# Patient Record
Sex: Female | Born: 1965 | Race: Black or African American | Hispanic: No | Marital: Single | Smoking: Former smoker
Health system: Southern US, Community
[De-identification: ages and names within clinical notes are randomized; demographics above are authoritative.]

## PROBLEM LIST (undated history)

## (undated) DIAGNOSIS — I10 Essential (primary) hypertension: Secondary | ICD-10-CM

## (undated) HISTORY — DX: Essential (primary) hypertension: I10

---

## 2000-01-23 ENCOUNTER — Emergency Department (HOSPITAL_COMMUNITY): Admission: EM | Admit: 2000-01-23 | Discharge: 2000-01-23 | Payer: Self-pay | Admitting: Emergency Medicine

## 2007-12-01 ENCOUNTER — Emergency Department (HOSPITAL_COMMUNITY): Admission: EM | Admit: 2007-12-01 | Discharge: 2007-12-01 | Payer: Self-pay | Admitting: Emergency Medicine

## 2011-06-16 ENCOUNTER — Encounter: Payer: Self-pay | Admitting: Emergency Medicine

## 2011-06-16 ENCOUNTER — Emergency Department (HOSPITAL_COMMUNITY)
Admission: EM | Admit: 2011-06-16 | Discharge: 2011-06-17 | Disposition: A | Discharge status: Home / Self Care | Payer: BC Managed Care – PPO | Attending: Emergency Medicine | Admitting: Emergency Medicine

## 2011-06-16 DIAGNOSIS — R062 Wheezing: Secondary | ICD-10-CM | POA: Insufficient documentation

## 2011-06-16 DIAGNOSIS — R110 Nausea: Secondary | ICD-10-CM | POA: Insufficient documentation

## 2011-06-16 DIAGNOSIS — R51 Headache: Secondary | ICD-10-CM | POA: Insufficient documentation

## 2011-06-16 DIAGNOSIS — R059 Cough, unspecified: Secondary | ICD-10-CM | POA: Insufficient documentation

## 2011-06-16 DIAGNOSIS — R0602 Shortness of breath: Secondary | ICD-10-CM | POA: Insufficient documentation

## 2011-06-16 DIAGNOSIS — J40 Bronchitis, not specified as acute or chronic: Secondary | ICD-10-CM

## 2011-06-16 DIAGNOSIS — R630 Anorexia: Secondary | ICD-10-CM | POA: Insufficient documentation

## 2011-06-16 DIAGNOSIS — R05 Cough: Secondary | ICD-10-CM | POA: Insufficient documentation

## 2011-06-16 NOTE — ED Notes (Signed)
Pt c/o cough x's 2 weeks. Has been taking OTC meds without relief

## 2011-06-17 ENCOUNTER — Encounter (HOSPITAL_COMMUNITY): Payer: Self-pay | Admitting: Emergency Medicine

## 2011-06-17 MED ORDER — ALBUTEROL SULFATE (5 MG/ML) 0.5% IN NEBU
5.0000 mg | INHALATION_SOLUTION | Freq: Once | RESPIRATORY_TRACT | Status: AC
  Administered 2011-06-17: 5 mg via RESPIRATORY_TRACT
  Filled 2011-06-17: qty 1

## 2011-06-17 MED ORDER — HYDROCOD POLST-CHLORPHEN POLST 10-8 MG/5ML PO LQCR: 5.0000 mL | Freq: Two times a day (BID) | ORAL | Status: DC

## 2011-06-17 MED ORDER — IPRATROPIUM BROMIDE 0.02 % IN SOLN
0.5000 mg | Freq: Once | RESPIRATORY_TRACT | Status: AC
  Administered 2011-06-17: 0.5 mg via RESPIRATORY_TRACT
  Filled 2011-06-17: qty 2.5

## 2011-06-17 MED ORDER — HYDROCOD POLST-CHLORPHEN POLST 10-8 MG/5ML PO LQCR
5.0000 mL | Freq: Once | ORAL | Status: AC
  Administered 2011-06-17: 5 mL via ORAL
  Filled 2011-06-17: qty 5

## 2011-06-17 MED ORDER — ALBUTEROL SULFATE HFA 108 (90 BASE) MCG/ACT IN AERS
2.0000 | INHALATION_SPRAY | RESPIRATORY_TRACT | Status: DC | PRN
  Administered 2011-06-17: 2 via RESPIRATORY_TRACT

## 2011-06-17 NOTE — ED Provider Notes (Signed)
Medical screening examination/treatment/procedure(s) were performed by non-physician practitioner and as supervising physician I was immediately available for consultation/collaboration.   Vida Roller, MD 06/17/11 [phone]

## 2011-06-17 NOTE — ED Provider Notes (Signed)
History     CSN: [id number] Arrival date & time: 06/16/2011 10:33 PM   First MD Initiated Contact with Patient 06/16/11 2343      Chief Complaint  Patient presents with  . Cough    HPI  History provided by the patient. Patient presents with complaints of a persistent dry cough for the past 2 weeks. Patient states she feels like she needs to bring up something but never does. Patient also reports that some coughing fits cause her to gag and feel nauseous. She denies any fever, chills, vomiting, abdominal pain, diarrhea, constipation. Patient has tried several over-the-counter cough and cold medications without any improvement. Patient does not report any aggravating or alleviating factors. Patient is a smoker. Patient denies any other past medical history.  History reviewed. No pertinent past medical history.  History reviewed. No pertinent past surgical history.  History reviewed. No pertinent family history.  History  Substance Use Topics  . Smoking status: Current Everyday Smoker    Types: Cigarettes  . Smokeless tobacco: Not on file  . Alcohol Use: No    OB History    Grav Para Term Preterm Abortions TAB SAB Ect Mult Living                  Review of Systems  Constitutional: Positive for appetite change. Negative for fever and chills.  HENT: Negative for congestion, sore throat and rhinorrhea.   Respiratory: Positive for cough and shortness of breath.   Cardiovascular: Negative for chest pain.  Gastrointestinal: Negative for nausea, vomiting, abdominal pain, diarrhea and constipation.  Neurological: Positive for headaches. Negative for weakness.  All other systems reviewed and are negative.    Allergies  Avocado and Shellfish allergy  Home Medications   Current Outpatient Rx  Name Route Sig Dispense Refill  . GUAIFENESIN 100 MG/5ML PO SOLN Oral Take 5 mLs by mouth every 4 (four) hours as needed. For cough     . MENTHOL 10 MG MT LOZG Mouth/Throat Use as  directed 1 lozenge in the mouth or throat every 2 (two) hours as needed. For sore throat     . ZINC 15 MG PO CAPS Oral Take 1 capsule by mouth 4 (four) times daily.        BP 152/92  Pulse 99  Temp 99 F (37.2 C)  Resp 18  SpO2 100%  Physical Exam  Nursing note and vitals reviewed. Constitutional: She is oriented to person, place, and time. She appears well-developed and well-nourished. No distress.  HENT:  Head: Normocephalic and atraumatic.  Mouth/Throat: Oropharynx is clear and moist.  Eyes: Conjunctivae and EOM are normal. Pupils are equal, round, and reactive to light.  Neck: Normal range of motion. Neck supple.  Cardiovascular: Normal rate, regular rhythm and normal heart sounds.   Pulmonary/Chest: Effort normal. She has wheezes. She has no rales.       Coughing  Abdominal: Soft. Bowel sounds are normal. There is no tenderness. There is no rebound and no guarding.  Lymphadenopathy:    She has no cervical adenopathy.  Neurological: She is alert and oriented to person, place, and time.  Skin: Skin is warm. No rash noted.  Psychiatric: She has a normal mood and affect. Her behavior is normal.    ED Course  Procedures (including critical care time)  1. Bronchitis       MDM  12:25 AM patient seen and evaluated. Patient in no acute distress. Patient is well-appearing and nontoxic.  1:00am pt feeling  much better after breathing treatments.  She has no fever.  Respirations normal and good O2 sats.  At this time suspect Bronchitis.       Angus Seller, [place] 06/17/11 [phone]

## 2011-07-01 ENCOUNTER — Emergency Department (HOSPITAL_COMMUNITY): Payer: BC Managed Care – PPO

## 2011-07-01 ENCOUNTER — Emergency Department (HOSPITAL_COMMUNITY)
Admission: EM | Admit: 2011-07-01 | Discharge: 2011-07-01 | Disposition: A | Discharge status: Home / Self Care | Payer: BC Managed Care – PPO | Attending: Emergency Medicine | Admitting: Emergency Medicine

## 2011-07-01 ENCOUNTER — Encounter (HOSPITAL_COMMUNITY): Payer: Self-pay

## 2011-07-01 DIAGNOSIS — F17200 Nicotine dependence, unspecified, uncomplicated: Secondary | ICD-10-CM | POA: Insufficient documentation

## 2011-07-01 DIAGNOSIS — R059 Cough, unspecified: Secondary | ICD-10-CM | POA: Insufficient documentation

## 2011-07-01 DIAGNOSIS — R05 Cough: Secondary | ICD-10-CM | POA: Insufficient documentation

## 2011-07-01 MED ORDER — BENZONATATE 100 MG PO CAPS
200.0000 mg | ORAL_CAPSULE | Freq: Once | ORAL | Status: AC
  Administered 2011-07-01: 200 mg via ORAL
  Filled 2011-07-01: qty 2

## 2011-07-01 MED ORDER — BENZONATATE 200 MG PO CAPS: 200.0000 mg | ORAL_CAPSULE | Freq: Three times a day (TID) | ORAL | Status: AC | PRN

## 2011-07-01 MED ORDER — BENZONATATE 100 MG PO CAPS: 200.0000 mg | ORAL_CAPSULE | Freq: Three times a day (TID) | ORAL | Status: AC | PRN

## 2011-07-01 MED ORDER — HYDROCOD POLST-CHLORPHEN POLST 10-8 MG/5ML PO LQCR: 5.0000 mL | Freq: Every evening | ORAL | Status: DC | PRN

## 2011-07-01 NOTE — ED Notes (Signed)
Patient presents with cough x 2 weeks, was seen in ED and given cough medication which relieved the cough until this past Saturday.  Patient reporting constant cough since, with abdominal pain and vomiting.

## 2011-07-02 NOTE — ED Provider Notes (Signed)
History     CSN: [id number]  Arrival date & time 07/01/11  1452   First MD Initiated Contact with Patient 07/01/11 1524      Chief Complaint  Patient presents with  . Cough    (Consider location/radiation/quality/duration/timing/severity/associated sxs/prior treatment) HPI Patient is a 45 yo F who presents for persistent cough.  She was last seen 12/6 for same.  She denies worsening but states that it just won't go away.  She denies fevers or myalgias.  PAtient is a long-time smoker but is trying to quit.  PAtient has no other symptoms. History reviewed. No pertinent past medical history.  History reviewed. No pertinent past surgical history.  History reviewed. No pertinent family history.  History  Substance Use Topics  . Smoking status: Current Everyday Smoker    Types: Cigarettes  . Smokeless tobacco: Not on file  . Alcohol Use: No    OB History    Grav Para Term Preterm Abortions TAB SAB Ect Mult Living                  Review of Systems  Constitutional: Negative.   HENT: Negative.   Eyes: Negative.   Respiratory: Positive for cough.   Cardiovascular: Negative.   Gastrointestinal: Negative.   Genitourinary: Negative.   Musculoskeletal: Negative.   Skin: Negative.   Neurological: Negative.   Hematological: Negative.   Psychiatric/Behavioral: Negative.   All other systems reviewed and are negative.    Allergies  Avocado and Shellfish allergy  Home Medications   Current Outpatient Rx  Name Route Sig Dispense Refill  . TETRAHYDROZ-POLYVINYL AL-POVID 0.05-0.5-0.6 % OP SOLN Ophthalmic Apply 2 drops to eye once.      Marland Kitchen ZINC 15 MG PO CAPS Oral Take 1 capsule by mouth 4 (four) times daily.      Marland Kitchen BENZONATATE 100 MG PO CAPS Oral Take 2 capsules (200 mg total) by mouth 3 (three) times daily as needed for cough. 21 capsule 0  . BENZONATATE 200 MG PO CAPS Oral Take 1 capsule (200 mg total) by mouth 3 (three) times daily as needed for cough. 30 capsule 0  .  HYDROCOD POLST-CHLORPHEN POLST 10-8 MG/5ML PO LQCR Oral Take 5 mLs by mouth at bedtime as needed. 140 mL 0    BP 166/100  Pulse 78  Temp(Src) 98.9 F (37.2 C) (Oral)  Resp 18  SpO2 98%  LMP 06/10/2011  Physical Exam  Nursing note and vitals reviewed. Constitutional: She is oriented to person, place, and time. She appears well-developed and well-nourished. No distress.  HENT:  Head: Normocephalic and atraumatic.  Eyes: Conjunctivae and EOM are normal. Pupils are equal, round, and reactive to light.  Neck: Normal range of motion.  Cardiovascular: Normal rate, regular rhythm, normal heart sounds and intact distal pulses.  Exam reveals no gallop and no friction rub.   No murmur heard. Pulmonary/Chest: Effort normal and breath sounds normal. No respiratory distress. She has no wheezes. She has no rales.  Abdominal: Soft. Bowel sounds are normal. She exhibits no distension. There is no tenderness. There is no rebound and no guarding.  Musculoskeletal: Normal range of motion.  Neurological: She is alert and oriented to person, place, and time. No cranial nerve deficit. She exhibits normal muscle tone. Coordination normal.  Skin: Skin is warm and dry. No rash noted.  Psychiatric: She has a normal mood and affect.    ED Course  Procedures (including critical care time)  Labs Reviewed - No data to display  Dg Chest 2 View  07/01/2011  *RADIOLOGY REPORT*  Clinical Data: Dry cough for 3 weeks  CHEST - 2 VIEW  Comparison: None  Findings: Normal heart size, mediastinal contours, and pulmonary vascularity. Lungs clear. Bones unremarkable. No pneumothorax.  IMPRESSION: Normal exam.  Original Report Authenticated By: Lollie Marrow, M.D.     1. Cough       MDM  Patient did have a repeat CXR to confirm she had not developed a pneumonia.  This was negative.  Patient was discharged in good condition with prescriptions for tussionex and tessalon perles as well as instructions to follow-up with a  PCP.        Cyndra Numbers, MD 07/02/11 [phone]

## 2013-07-01 IMAGING — CR DG CHEST 2V
2 series · 2 of 2 positions shown · non-contrast
Comparison: None

CLINICAL DATA: Dry cough for 3 weeks

CHEST - 2 VIEW

[w chest pa]
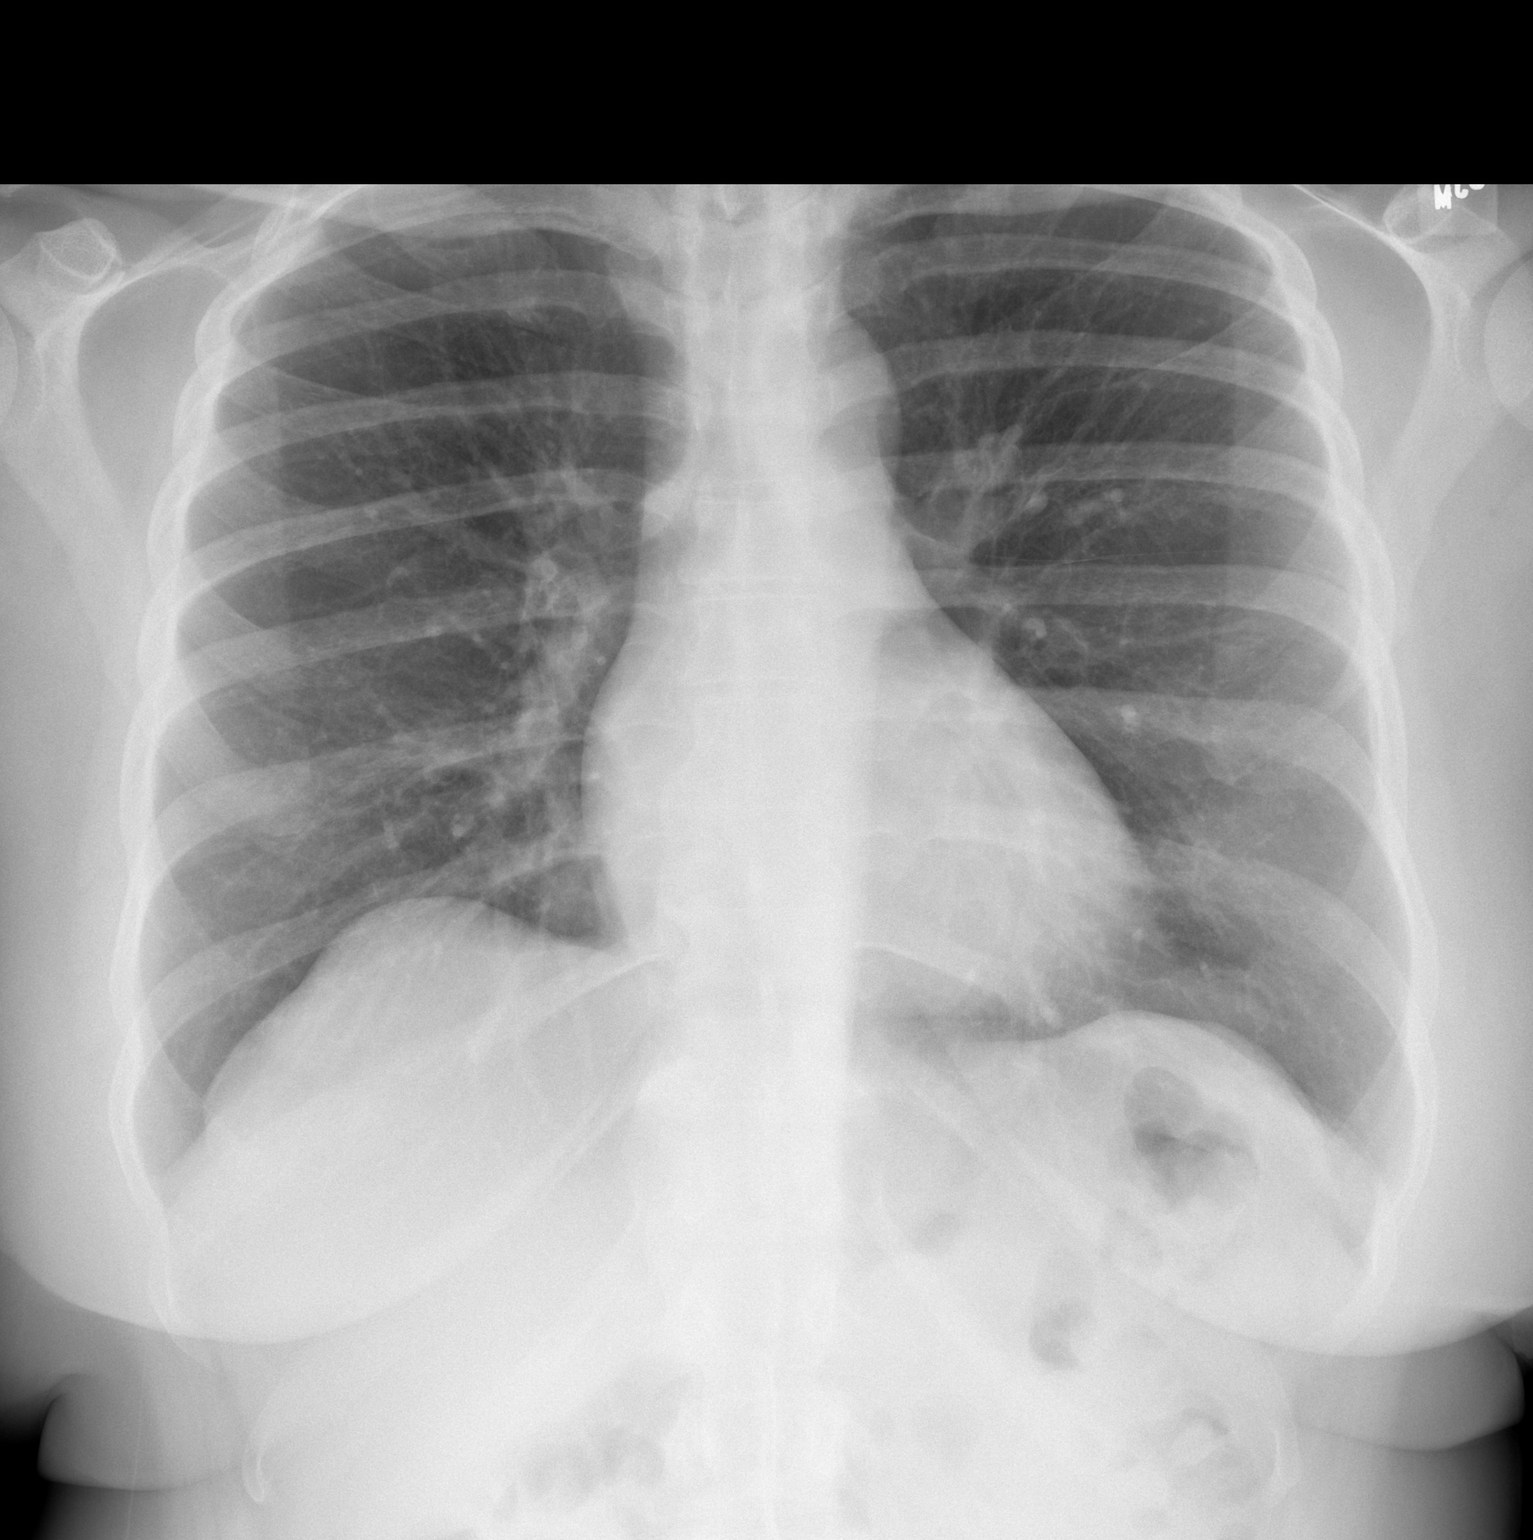

[w chest lat]
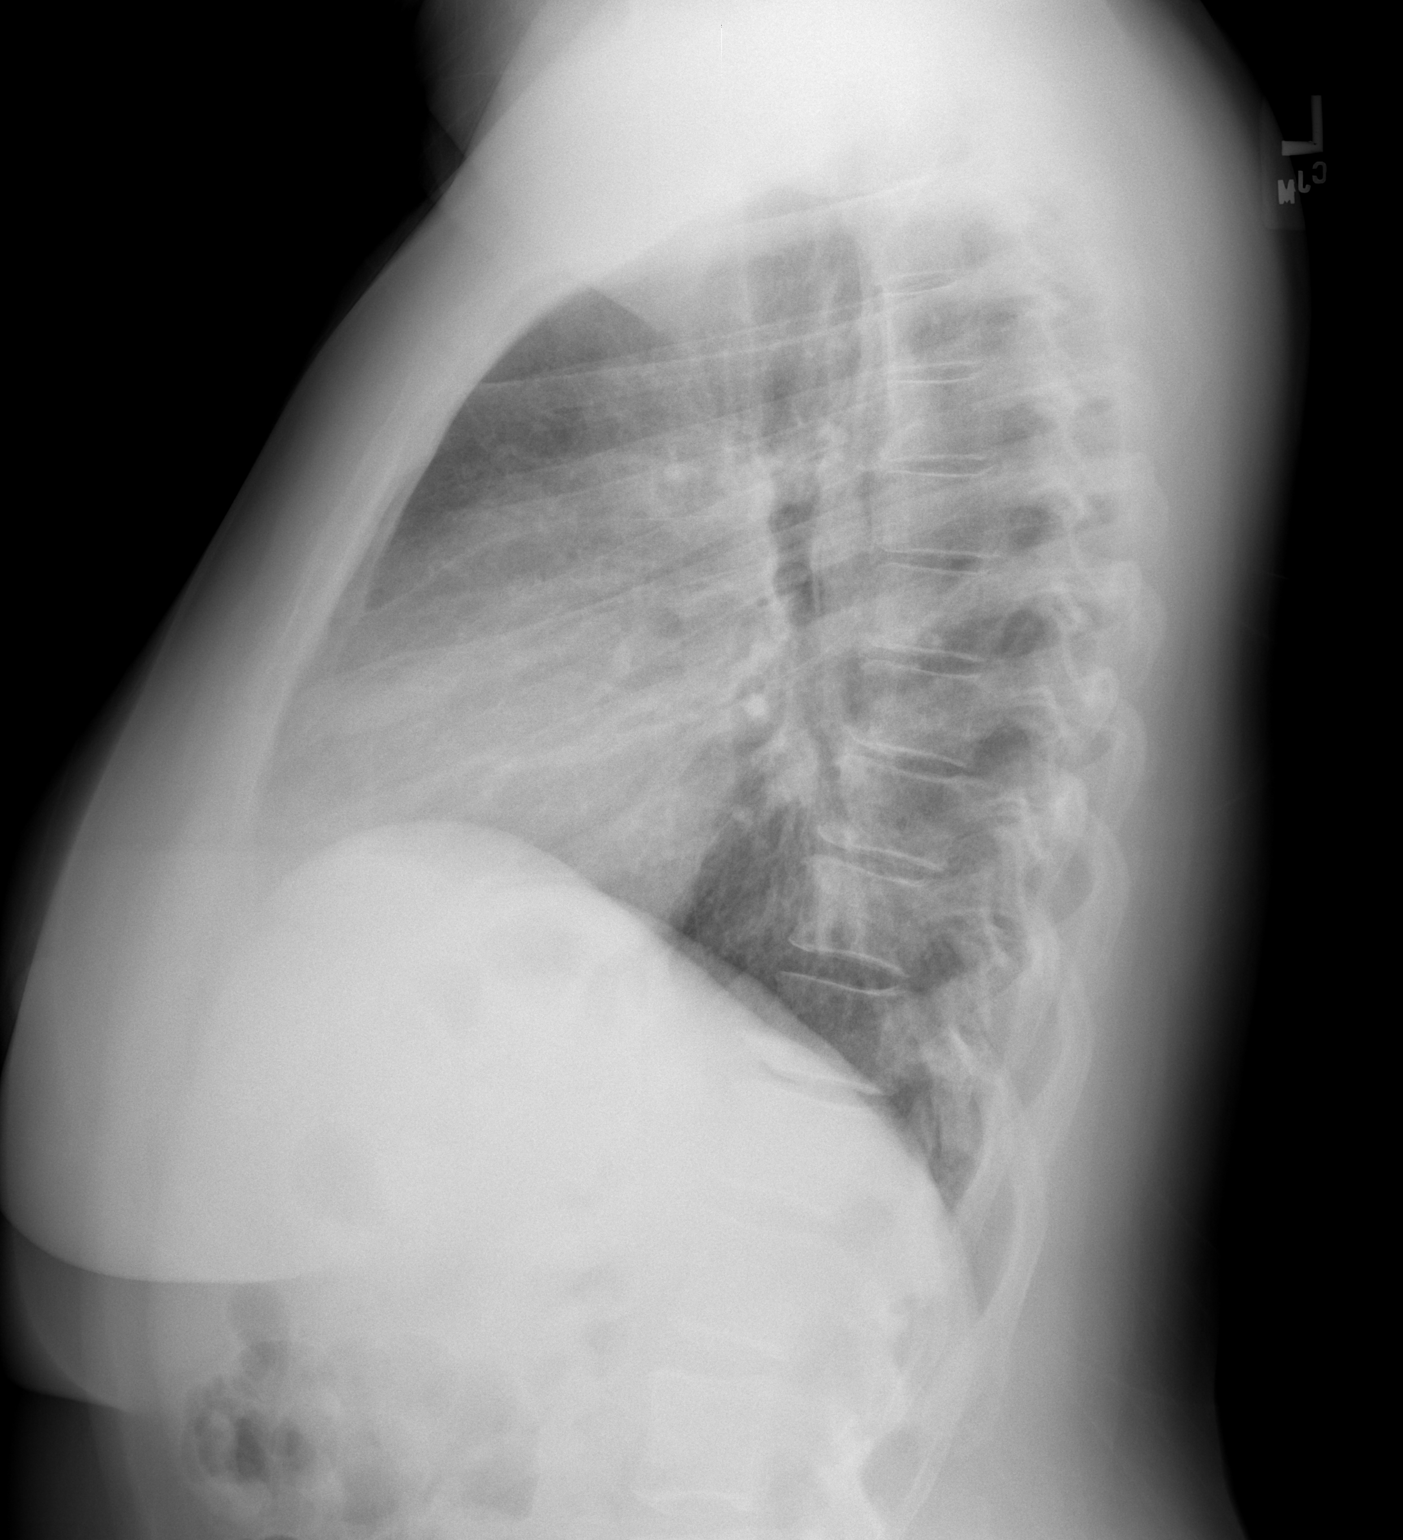

[2 of 2 positions shown; findings below may reference images not displayed]

FINDINGS: Normal heart size, mediastinal contours, and pulmonary vascularity.
Lungs clear.
Bones unremarkable.
No pneumothorax.
IMPRESSION: Normal exam.

## 2015-09-07 ENCOUNTER — Encounter (HOSPITAL_COMMUNITY): Payer: Self-pay | Admitting: Emergency Medicine

## 2015-09-07 ENCOUNTER — Emergency Department (HOSPITAL_COMMUNITY)
Admission: EM | Admit: 2015-09-07 | Discharge: 2015-09-07 | Disposition: A | Discharge status: Home / Self Care | Payer: Self-pay | Attending: Emergency Medicine | Admitting: Emergency Medicine

## 2015-09-07 DIAGNOSIS — F1810 Inhalant abuse, uncomplicated: Secondary | ICD-10-CM | POA: Insufficient documentation

## 2015-09-07 DIAGNOSIS — F17210 Nicotine dependence, cigarettes, uncomplicated: Secondary | ICD-10-CM | POA: Insufficient documentation

## 2015-09-07 DIAGNOSIS — F1210 Cannabis abuse, uncomplicated: Secondary | ICD-10-CM | POA: Insufficient documentation

## 2015-09-07 DIAGNOSIS — R400 Somnolence: Secondary | ICD-10-CM | POA: Insufficient documentation

## 2015-09-07 DIAGNOSIS — Z79899 Other long term (current) drug therapy: Secondary | ICD-10-CM | POA: Insufficient documentation

## 2015-09-07 DIAGNOSIS — F1910 Other psychoactive substance abuse, uncomplicated: Secondary | ICD-10-CM

## 2015-09-07 LAB — CBC WITH DIFFERENTIAL/PLATELET
BASOS ABS: 0 10*3/uL (ref 0.0–0.1)
BASOS PCT: 0 %
EOS PCT: 3 %
Eosinophils Absolute: 0.2 10*3/uL (ref 0.0–0.7)
HEMATOCRIT: 34.4 % — AB (ref 36.0–46.0)
Hemoglobin: 10.1 g/dL — ABNORMAL LOW (ref 12.0–15.0)
LYMPHS PCT: 22 %
Lymphs Abs: 1.4 10*3/uL (ref 0.7–4.0)
MCH: 23 pg — ABNORMAL LOW (ref 26.0–34.0)
MCHC: 29.4 g/dL — ABNORMAL LOW (ref 30.0–36.0)
MCV: 78.2 fL (ref 78.0–100.0)
MONO ABS: 0.6 10*3/uL (ref 0.1–1.0)
Monocytes Relative: 10 %
NEUTROS ABS: 3.9 10*3/uL (ref 1.7–7.7)
Neutrophils Relative %: 65 %
PLATELETS: 365 10*3/uL (ref 150–400)
RBC: 4.4 MIL/uL (ref 3.87–5.11)
RDW: 17.3 % — AB (ref 11.5–15.5)
WBC: 6.1 10*3/uL (ref 4.0–10.5)

## 2015-09-07 LAB — RAPID URINE DRUG SCREEN, HOSP PERFORMED
Amphetamines: NOT DETECTED
BARBITURATES: NOT DETECTED
BENZODIAZEPINES: NOT DETECTED
Cocaine: NOT DETECTED
Opiates: NOT DETECTED
Tetrahydrocannabinol: NOT DETECTED

## 2015-09-07 LAB — BASIC METABOLIC PANEL
ANION GAP: 8 (ref 5–15)
BUN: 16 mg/dL (ref 6–20)
CALCIUM: 8.9 mg/dL (ref 8.9–10.3)
CO2: 23 mmol/L (ref 22–32)
Chloride: 110 mmol/L (ref 101–111)
Creatinine, Ser: 0.73 mg/dL (ref 0.44–1.00)
Glucose, Bld: 111 mg/dL — ABNORMAL HIGH (ref 65–99)
POTASSIUM: 3.9 mmol/L (ref 3.5–5.1)
Sodium: 141 mmol/L (ref 135–145)

## 2015-09-07 LAB — ETHANOL

## 2015-09-07 NOTE — Discharge Instructions (Signed)
Polysubstance Abuse °When people abuse more than one drug or type of drug it is called polysubstance or polydrug abuse. For example, many smokers also drink alcohol. This is one form of polydrug abuse. Polydrug abuse also refers to the use of a drug to counteract an unpleasant effect produced by another drug. It may also be used to help with withdrawal from another drug. People who take stimulants may become agitated. Sometimes this agitation is countered with a tranquilizer. This helps protect against the unpleasant side effects. Polydrug abuse also refers to the use of different drugs at the same time.  °Anytime drug use is interfering with normal living activities, it has become abuse. This includes problems with family and friends. Psychological dependence has developed when your mind tells you that the drug is needed. This is usually followed by physical dependence which has developed when continuing increases of drug are required to get the same feeling or "high". This is known as addiction or chemical dependency. A person's risk is much higher if there is a history of chemical dependency in the family. °SIGNS OF CHEMICAL DEPENDENCY °· You have been told by friends or family that drugs have become a problem. °· You fight when using drugs. °· You are having blackouts (not remembering what you do while using). °· You feel sick from using drugs but continue using. °· You lie about use or amounts of drugs (chemicals) used. °· You need chemicals to get you going. °· You are suffering in work performance or in school because of drug use. °· You get sick from use of drugs but continue to use anyway. °· You need drugs to relate to people or feel comfortable in social situations. °· You use drugs to forget problems. °"Yes" answered to any of the above signs of chemical dependency indicates there are problems. The longer the use of drugs continues, the greater the problems will become. °If there is a family history of  drug or alcohol use, it is best not to experiment with these drugs. Continual use leads to tolerance. After tolerance develops more of the drug is needed to get the same feeling. This is followed by addiction. With addiction, drugs become the most important part of life. It becomes more important to take drugs than participate in the other usual activities of life. This includes relating to friends and family. Addiction is followed by dependency. Dependency is a condition where drugs are now needed not just to get high, but to feel normal. °Addiction cannot be cured but it can be stopped. This often requires outside help and the care of professionals. Treatment centers are listed in the yellow pages under: Cocaine, Narcotics, and Alcoholics Anonymous. Most hospitals and clinics can refer you to a specialized care center. Talk to your caregiver if you need help. °  °This information is not intended to replace advice given to you by your health care provider. Make sure you discuss any questions you have with your health care provider. °  °Document Released: 02/17/2005 Document Revised: 09/20/2011 Document Reviewed: 07/03/2014 °Elsevier Interactive Patient Education ©2016 [employer] ° °

## 2015-09-07 NOTE — ED Notes (Signed)
Bed: WU98 Expected date:  Expected time:  Means of arrival:  Comments: 50 yo M  Drug use and huffing air freshner

## 2015-09-07 NOTE — ED Notes (Signed)
Patient states that she had a broken upper partial denture when she was pickedup by [facility]. She recalls that EMS took that partial from her but does not have it at this time. This nurse helped her look through her belongings but we were unable to locate the partial. Patient encouraged to follow up with [facility].  Patient also came in with 2 altoid boxes of synthetic marijuana and air fresheners which she states she had used for 'huffing'. [facility] [employer] contacted for recommendations regarding disposal of illegal substances. [place] [employer] will witness disposal of these substances.

## 2015-09-07 NOTE — ED Provider Notes (Signed)
CSN: [id number]     Arrival date & time 09/07/15  0259 History  By signing my name below, I, Marisue Humble, attest that this documentation has been prepared under the direction and in the presence of Shon Baton, MD . Electronically Signed: Marisue Humble, Scribe. 09/07/2015. 3:33 AM.   Chief Complaint  Patient presents with  . Drug Problem   LEVEL 5 CAVEAT: HPI limited due to unresponsive pt condition  The history is provided by the patient. The history is limited by the condition of the patient. No language interpreter was used.   HPI Comments:  Teresa Phillips is a 50 y.o. female brought in by EMS who presents to the Emergency Department complaining of drug abuse. EMS reported pt smoked a container of K2 spice and huffed 5 cans of glade air freshener; pt denies huffing air freshener. Pt states a relative probably called EMS; she was difficult to wake during exam. Was given IM Haldol in the ambulance for combativeness. She denies alcohol or mariajuana use.  Patient will awake but not contribute further to history.  History reviewed. No pertinent past medical history. History reviewed. No pertinent past surgical history. No family history on file. Social History  Substance Use Topics  . Smoking status: Current Every Day Smoker    Types: Cigarettes  . Smokeless tobacco: None  . Alcohol Use: No   OB History    No data available     Review of Systems  Unable to perform ROS: Patient unresponsive    Allergies  Avocado and Shellfish allergy  Home Medications   Prior to Admission medications   Medication Sig Start Date End Date Taking? Authorizing Provider  chlorpheniramine-HYDROcodone (TUSSIONEX PENNKINETIC ER) 10-8 MG/5ML LQCR Take 5 mLs by mouth at bedtime as needed. 07/01/11   Cyndra Numbers, MD  Tetrahydroz-Polyvinyl Al-Povid (CLEAR EYES TRIPLE ACTION) 0.05-0.5-0.6 % SOLN Apply 2 drops to eye once.      Historical Provider, MD  Zinc 15 MG CAPS Take 1 capsule by mouth 4  (four) times daily.      Historical Provider, MD   BP 114/56 mmHg  Pulse 83  Temp(Src) 97.8 F (36.6 C) (Oral)  Resp 16  SpO2 97% Physical Exam  Constitutional: No distress.  Somnolent but arousable  HENT:  Head: Normocephalic and atraumatic.  Eyes: Pupils are equal, round, and reactive to light.  Pupils 6 mm and reactive bilaterally  Cardiovascular: Normal rate, regular rhythm and normal heart sounds.   No murmur heard. Pulmonary/Chest: Effort normal and breath sounds normal. No respiratory distress. She has no wheezes.  Abdominal: Soft. Bowel sounds are normal. There is no tenderness. There is no rebound.  Neurological: She is alert.  Somnolent but arousable, moves all 4 extremities, follows simple commands  Skin: Skin is warm and dry.  Psychiatric: She has a normal mood and affect.  Nursing note and vitals reviewed.   ED Course  Procedures  DIAGNOSTIC STUDIES:  Oxygen Saturation is 97% on RA, normal by my interpretation.    COORDINATION OF CARE:  6:58 AM Discussed treatment plan with pt at bedside and pt agreed to plan.  Labs Review Labs Reviewed  CBC WITH DIFFERENTIAL/PLATELET - Abnormal; Notable for the following:    Hemoglobin 10.1 (*)    HCT 34.4 (*)    MCH 23.0 (*)    MCHC 29.4 (*)    RDW 17.3 (*)    All other components within normal limits  BASIC METABOLIC PANEL - Abnormal; Notable for the following:  Glucose, Bld 111 (*)    All other components within normal limits  URINE RAPID DRUG SCREEN, HOSP PERFORMED  ETHANOL    Imaging Review No results found. I have personally reviewed and evaluated these images and lab results as part of my medical decision-making.   EKG Interpretation   Date/Time:  Sunday September 07 2015 03:18:16 EST Ventricular Rate:  80 PR Interval:  164 QRS Duration: 96 QT Interval:  386 QTC Calculation: 445 R Axis:   8 Text Interpretation:  Sinus rhythm Confirmed by Tecumseh Yeagley  MD, Bane Hagy  ([id number]) on 09/07/2015 5:23:43 AM       MDM   Final diagnoses:  Polysubstance abuse    Patient presents after reportedly smoking K2. Received IM Haldol. Is currently arousable but not very contributory to history taking. No acute distress. Airway intact. Basic labwork including ethanol UDS obtained and reassuring. On repeat examination, she is more easily arousable. Continues to be in no acute distress. Does endorse smoking K2. She was able to eat and ambulate independently.  After history, exam, and medical workup I feel the patient has been appropriately medically screened and is safe for discharge home. Pertinent diagnoses were discussed with the patient. Patient was given return precautions.  I personally performed the services described in this documentation, which was scribed in my presence. The recorded information has been reviewed and is accurate.    Shon Baton, MD 09/07/15 [phone]

## 2015-09-07 NOTE — ED Notes (Signed)
Patient here from home with complaints of drug abuse. Reports smoking a container of K2 spice. Also huffed 5 cans of glade air freshener. Combative, confused. Given  Haldol IM.

## 2019-01-05 ENCOUNTER — Other Ambulatory Visit: Payer: Self-pay | Admitting: *Deleted

## 2019-01-05 DIAGNOSIS — Z20822 Contact with and (suspected) exposure to covid-19: Secondary | ICD-10-CM

## 2019-01-10 LAB — NOVEL CORONAVIRUS, NAA: SARS-CoV-2, NAA: NOT DETECTED

## 2019-01-15 ENCOUNTER — Ambulatory Visit (INDEPENDENT_AMBULATORY_CARE_PROVIDER_SITE_OTHER): Payer: Self-pay

## 2019-01-15 ENCOUNTER — Ambulatory Visit (HOSPITAL_COMMUNITY)
Admission: EM | Admit: 2019-01-15 | Discharge: 2019-01-15 | Disposition: A | Discharge status: Home / Self Care | Payer: Self-pay | Attending: Family Medicine | Admitting: Family Medicine

## 2019-01-15 ENCOUNTER — Encounter (HOSPITAL_COMMUNITY): Payer: Self-pay

## 2019-01-15 DIAGNOSIS — R059 Cough, unspecified: Secondary | ICD-10-CM

## 2019-01-15 DIAGNOSIS — R05 Cough: Secondary | ICD-10-CM

## 2019-01-15 MED ORDER — ALBUTEROL SULFATE HFA 108 (90 BASE) MCG/ACT IN AERS: 1.0000 | INHALATION_SPRAY | Freq: Four times a day (QID) | RESPIRATORY_TRACT | 0 refills | Status: DC | PRN

## 2019-01-15 MED ORDER — BENZONATATE 100 MG PO CAPS: 100.0000 mg | ORAL_CAPSULE | Freq: Three times a day (TID) | ORAL | 0 refills | Status: DC

## 2019-01-15 MED ORDER — PREDNISONE 20 MG PO TABS: 40.0000 mg | ORAL_TABLET | Freq: Every day | ORAL | 0 refills | Status: AC

## 2019-01-15 NOTE — ED Provider Notes (Signed)
[facility]    CSN: [id number] Arrival date & time: 01/15/19  1555     History   Chief Complaint Chief Complaint  Patient presents with  . Cough    HPI Teresa Phillips is a 53 y.o. female.   Teresa CooperRita C Ambs presents with complaints of cough. "short hacking spasms".  Phlegm initially, but now it is more dry. Started in May. No shortness of breath . No chest pain . No fevers. No back pain. Not worse at night. Laying flat is ok. No known triggers. Has tried OTC cough syrups which do not help. Has been taking claritin d which hasn't helped. Some nasal drainage, feels she may have had some allergy symptoms. Did have sore throat/ tickle, but now it is gone. No ear pain. No gi symptoms. No leg swelling. Recent weight gain with menopause. Smokes, approximately 3 packs a week. No asthma or copd history. Tested negative for Covid 19 6/26. Denies any new activity limitations due to symptoms. Without contributing medical history.      ROS per HPI, negative if not otherwise mentioned.      History reviewed. No pertinent past medical history.  There are no active problems to display for this patient.   History reviewed. No pertinent surgical history.  OB History   No obstetric history on file.      Home Medications    Prior to Admission medications   Medication Sig Start Date End Date Taking? Authorizing Provider  albuterol (PROAIR HFA) 108 (90 Base) MCG/ACT inhaler Inhale 1-2 puffs into the lungs every 6 (six) hours as needed for wheezing or shortness of breath. 01/15/19   Georgetta HaberBurky, Natalie B, NP  benzonatate (TESSALON) 100 MG capsule Take 1 capsule (100 mg total) by mouth every 8 (eight) hours. 01/15/19   Georgetta HaberBurky, Natalie B, NP  chlorpheniramine-HYDROcodone (TUSSIONEX PENNKINETIC ER) 10-8 MG/5ML LQCR Take 5 mLs by mouth at bedtime as needed. 07/01/11   Cyndra NumbersHunt, Meagan, MD  predniSONE (DELTASONE) 20 MG tablet Take 2 tablets (40 mg total) by mouth daily with breakfast for 5 days. 01/15/19  01/20/19  Georgetta HaberBurky, Natalie B, NP  Tetrahydroz-Polyvinyl Al-Povid (CLEAR EYES TRIPLE ACTION) 0.05-0.5-0.6 % SOLN Apply 2 drops to eye once.      [provider]  Zinc 15 MG CAPS Take 1 capsule by mouth 4 (four) times daily.      [provider]    Family History History reviewed. No pertinent family history.  Social History Social History   Tobacco Use  . Smoking status: Current Every Day Smoker    Types: Cigarettes  Substance Use Topics  . Alcohol use: No  . Drug use: No     Allergies   Avocado and Shellfish allergy   Review of Systems Review of Systems   Physical Exam Triage Vital Signs ED Triage Vitals  Enc Vitals Group     BP 01/15/19 1613 (!) 169/110     Pulse Rate 01/15/19 1613 92     Resp 01/15/19 1613 18     Temp 01/15/19 1613 98.3 F (36.8 C)     Temp Source 01/15/19 1613 Oral     SpO2 01/15/19 1613 99 %     Weight --      Height --      Head Circumference --      Peak Flow --      Pain Score 01/15/19 1616 0     Pain Loc --      Pain Edu? --  Excl. in GC? --    No data found.  Updated Vital Signs BP (!) 169/110 (BP Location: Right Arm)   Pulse 92   Temp 98.3 F (36.8 C) (Oral)   Resp 18   SpO2 99%    Physical Exam Constitutional:      General: She is not in acute distress.    Appearance: She is well-developed.  Cardiovascular:     Rate and Rhythm: Normal rate and regular rhythm.     Pulses: Normal pulses.  Pulmonary:     Effort: Pulmonary effort is normal. No respiratory distress.     Breath sounds: Normal breath sounds. No wheezing or rhonchi.  Musculoskeletal:     Right lower leg: No edema.     Left lower leg: No edema.  Skin:    General: Skin is warm and dry.  Neurological:     Mental Status: She is alert and oriented to person, place, and time.      UC Treatments / Results  Labs (all labs ordered are listed, but only abnormal results are displayed) Labs Reviewed - No data to display  EKG   Radiology  Dg Chest 2 View  Result Date: 01/15/2019 CLINICAL DATA:  Productive cough for 2 months EXAM: CHEST - 2 VIEW COMPARISON:  05/01/2011 FINDINGS: The heart size and mediastinal contours are within normal limits. Both lungs are clear. The visualized skeletal structures are unremarkable. IMPRESSION: No active cardiopulmonary disease. Electronically Signed   By: Inez Catalina M.D.   On: 01/15/2019 17:00    Procedures Procedures (including critical care time)  Medications Ordered in UC Medications - No data to display  Initial Impression / Assessment and Plan / UC Course  I have reviewed the triage vital signs and the nursing notes.  Pertinent labs & imaging results that were available during my care of the patient were reviewed by me and considered in my medical decision making (see chart for details).     Non toxic in appearance. Lungs clear here today. No increased work of breathing, no swelling. No increased cough while laying. She does smoke. Discussed cessation. Will treat bronchitis with prednisone. Inhaler prn. Discussed reflux as differential as well. Encouraged follow up with PCP for recheck and for BP recheck. Patient verbalized understanding and agreeable to plan.   Final Clinical Impressions(s) / UC Diagnoses   Final diagnoses:  Cough     Discharge Instructions     5 days of prednisone.  Use of inhaler as needed for wheezing or shortness of breath.   Tessalon as needed for cough.  Your xray looks great today.  Please establish with a primary care provider for recheck of your cough and BP.  Please continue to decrease smoking to quit as able as this can contribute to your cough as well.     ED Prescriptions    Medication Sig Dispense Auth. Provider   predniSONE (DELTASONE) 20 MG tablet Take 2 tablets (40 mg total) by mouth daily with breakfast for 5 days. 10 tablet Augusto Gamble B, NP   benzonatate (TESSALON) 100 MG capsule Take 1 capsule (100 mg total) by mouth every 8  (eight) hours. 21 capsule Augusto Gamble B, NP   albuterol (PROAIR HFA) 108 (90 Base) MCG/ACT inhaler Inhale 1-2 puffs into the lungs every 6 (six) hours as needed for wheezing or shortness of breath. 18 g Zigmund Gottron, NP     Controlled Substance Prescriptions [place] Controlled Substance Registry consulted? Not Applicable   Augusto Gamble  B, NP 01/15/19 1749

## 2019-01-15 NOTE — ED Triage Notes (Signed)
Pt C/o dry cough, pt states she had the cough since May.  The cough is very non productive.

## 2019-01-15 NOTE — Discharge Instructions (Signed)
5 days of prednisone.  Use of inhaler as needed for wheezing or shortness of breath.   Tessalon as needed for cough.  Your xray looks great today.  Please establish with a primary care provider for recheck of your cough and BP.  Please continue to decrease smoking to quit as able as this can contribute to your cough as well.

## 2019-04-03 ENCOUNTER — Ambulatory Visit (HOSPITAL_COMMUNITY)
Admission: EM | Admit: 2019-04-03 | Discharge: 2019-04-03 | Disposition: A | Discharge status: Home / Self Care | Payer: Self-pay | Attending: Family Medicine | Admitting: Family Medicine

## 2019-04-03 ENCOUNTER — Encounter (HOSPITAL_COMMUNITY): Payer: Self-pay

## 2019-04-03 DIAGNOSIS — I10 Essential (primary) hypertension: Secondary | ICD-10-CM | POA: Insufficient documentation

## 2019-04-03 DIAGNOSIS — R059 Cough, unspecified: Secondary | ICD-10-CM

## 2019-04-03 DIAGNOSIS — R062 Wheezing: Secondary | ICD-10-CM | POA: Insufficient documentation

## 2019-04-03 DIAGNOSIS — Z20828 Contact with and (suspected) exposure to other viral communicable diseases: Secondary | ICD-10-CM | POA: Insufficient documentation

## 2019-04-03 DIAGNOSIS — F17210 Nicotine dependence, cigarettes, uncomplicated: Secondary | ICD-10-CM | POA: Insufficient documentation

## 2019-04-03 DIAGNOSIS — R05 Cough: Secondary | ICD-10-CM | POA: Insufficient documentation

## 2019-04-03 MED ORDER — AMLODIPINE BESYLATE 5 MG PO TABS: 5.0000 mg | ORAL_TABLET | Freq: Every day | ORAL | 1 refills | Status: DC

## 2019-04-03 MED ORDER — BENZONATATE 100 MG PO CAPS: 100.0000 mg | ORAL_CAPSULE | Freq: Three times a day (TID) | ORAL | 0 refills | Status: DC

## 2019-04-03 MED ORDER — PREDNISONE 10 MG (21) PO TBPK: ORAL_TABLET | Freq: Every day | ORAL | 0 refills | Status: DC

## 2019-04-03 NOTE — ED Provider Notes (Signed)
[facility]   [id number] 04/03/19 Arrival Time: 4008  ASSESSMENT & PLAN:  1. Cough   2. Wheezing   3. Uncontrolled hypertension     She would like to begin a medication to treat HTN. No indication for chest imaging at this time. Encouraged smoking cessation; she feels ready to quit. Encouraged her to est care with a PCP>  Meds ordered this encounter  Medications  . benzonatate (TESSALON) 100 MG capsule    Sig: Take 1 capsule (100 mg total) by mouth every 8 (eight) hours.    Dispense:  21 capsule    Refill:  0  . predniSONE (STERAPRED UNI-PAK 21 TAB) 10 MG (21) TBPK tablet    Sig: Take by mouth daily. Take as directed.    Dispense:  21 tablet    Refill:  0  . amLODipine (NORVASC) 5 MG tablet    Sig: Take 1 tablet (5 mg total) by mouth daily.    Dispense:  30 tablet    Refill:  1    Follow-up Information    [facility].   Specialty: Urgent Care Why: As needed. You may return to recheck your blood pressure within the next week. Contact information: [address] [place]         COVID-19 testing sent. Low suspicion.  Reviewed expectations re: course of current medical issues. Questions answered. Outlined signs and symptoms indicating need for more acute intervention. Patient verbalized understanding. After Visit Summary given.   SUBJECTIVE: History from: patient.  Teresa Phillips is a 53 y.o. female who presents with complaint of a dry hacking cough for at least a couple of months. Seen here 01/2019; meds helped some. Continues to smoke cigarettes. Afebrile. Cough present day and night. No associated CP or SOB reported. No wheezing. Tried OTC allergy medications; questions some help. No GERD symptoms. Overall normal PO intake without n/v. Known sick contacts: none. No specific or significant aggravating or alleviating factors reported. Weight stable. CXR normal at last visit.   Social History   Tobacco Use  Smoking Status Current Every Day Smoker  . Types: Cigarettes  Smokeless Tobacco Never Used   Increased blood pressure noted today. Reports that she has not been treated for hypertension in the past. BP elevated at last visit here.  She reports no chest pain on exertion, no dyspnea on exertion, no swelling of ankles, no orthostatic dizziness or lightheadedness, no orthopnea or paroxysmal nocturnal dyspnea, no palpitations and no intermittent claudication symptoms.  ROS: As per HPI. All other systems negative.    OBJECTIVE:  Vitals:   04/03/19 1616  BP: (!) 212/116  Pulse: 80  Resp: 16  Temp: 97.8 F (36.6 C)     General appearance: alert; NAD HEENT: nasal congestion; clear runny nose; throat irritation secondary to post-nasal drainage Neck: supple without LAD CV: RRR Lungs: unlabored respirations, symmetrical air entry with mild expiratory wheezing taht cleared with deep breaths; cough: mild and dry Abd: soft Ext: no LE edema Skin: warm and dry Psychological: alert and cooperative; normal mood and affect   Allergies  Allergen Reactions  . Avocado Other (See Comments)    welps.  . Shellfish Allergy Other (See Comments)    welps.   FH: Question of HTN.  Social History   Socioeconomic History  . Marital status: Single    Spouse name: Not on file  . Number of children: Not on file  . Years of  education: Not on file  . Highest education level: Not on file  Occupational History  . Not on file  Social Needs  . Financial resource strain: Not on file  . Food insecurity    Worry: Not on file    Inability: Not on file  . Transportation needs    Medical: Not on file    Non-medical: Not on file  Tobacco Use  . Smoking status: Current Every Day Smoker    Types: Cigarettes  . Smokeless tobacco: Never Used  Substance and Sexual Activity  . Alcohol use: No  . Drug use: No  . Sexual activity: Never    Birth control/protection: None   Lifestyle  . Physical activity    Days per week: Not on file    Minutes per session: Not on file  . Stress: Not on file  Relationships  . Social Musician on phone: Not on file    Gets together: Not on file    Attends religious service: Not on file    Active member of club or organization: Not on file    Attends meetings of clubs or organizations: Not on file    Relationship status: Not on file  . Intimate partner violence    Fear of current or ex partner: Not on file    Emotionally abused: Not on file    Physically abused: Not on file    Forced sexual activity: Not on file  Other Topics Concern  . Not on file  Social History Narrative  . Not on file           Mardella Layman, MD 04/03/19 [phone]

## 2019-04-03 NOTE — ED Triage Notes (Signed)
Patient reports having a dry cough for 2 months, is worse ifshe lie down. Patients is taking Mucinex, Robitussin, Benadryl and NyQuil, none of this medications are helping the patient.

## 2019-04-04 ENCOUNTER — Encounter (HOSPITAL_COMMUNITY): Payer: Self-pay

## 2019-04-04 LAB — NOVEL CORONAVIRUS, NAA (HOSP ORDER, SEND-OUT TO REF LAB; TAT 18-24 HRS): SARS-CoV-2, NAA: NOT DETECTED

## 2019-08-30 ENCOUNTER — Ambulatory Visit: Payer: Self-pay

## 2019-09-03 ENCOUNTER — Ambulatory Visit: Payer: Self-pay | Attending: Family

## 2019-09-03 DIAGNOSIS — Z23 Encounter for immunization: Secondary | ICD-10-CM | POA: Insufficient documentation

## 2019-09-03 NOTE — Progress Notes (Signed)
   Covid-19 Vaccination Clinic  Name:  KATHIE POSA    MRN: [id number] DOB: Feb 29, 1966  09/03/2019  Ms. Kahan was observed post Covid-19 immunization for 15 minutes without incidence. She was provided with Vaccine Information Sheet and instruction to access the V-Safe system.   Ms. Brandle was instructed to call 911 with any severe reactions post vaccine: Marland Kitchen Difficulty breathing  . Swelling of your face and throat  . A fast heartbeat  . A bad rash all over your body  . Dizziness and weakness    Immunizations Administered    Name Date Dose VIS Date Route   Moderna COVID-19 Vaccine 09/03/2019 10:15 AM 0.5 mL 06/12/2019 Intramuscular   Manufacturer: Moderna   Lot: [id number]   NDC: 90240-973-53

## 2019-10-02 ENCOUNTER — Ambulatory Visit: Payer: Self-pay | Attending: Family

## 2019-10-02 DIAGNOSIS — Z23 Encounter for immunization: Secondary | ICD-10-CM

## 2019-10-02 NOTE — Progress Notes (Signed)
   Covid-19 Vaccination Clinic  Name:  EDIN SKARDA    MRN: [id number] DOB: 1966-01-26  10/02/2019  Ms. Haris was observed post Covid-19 immunization for 15 minutes without incident. She was provided with Vaccine Information Sheet and instruction to access the V-Safe system.   Ms. Gavin was instructed to call 911 with any severe reactions post vaccine: Marland Kitchen Difficulty breathing  . Swelling of face and throat  . A fast heartbeat  . A bad rash all over body  . Dizziness and weakness   Immunizations Administered    Name Date Dose VIS Date Route   Moderna COVID-19 Vaccine 10/02/2019 12:01 PM 0.5 mL 06/12/2019 Intramuscular   Manufacturer: Moderna   Lot: [id number]   NDC: 05697-948-01

## 2019-11-16 ENCOUNTER — Encounter (HOSPITAL_COMMUNITY): Payer: Self-pay

## 2019-11-16 ENCOUNTER — Other Ambulatory Visit: Payer: Self-pay

## 2019-11-16 ENCOUNTER — Ambulatory Visit (HOSPITAL_COMMUNITY)
Admission: EM | Admit: 2019-11-16 | Discharge: 2019-11-16 | Disposition: A | Discharge status: Home / Self Care | Payer: Self-pay | Attending: Physician Assistant | Admitting: Physician Assistant

## 2019-11-16 DIAGNOSIS — I10 Essential (primary) hypertension: Secondary | ICD-10-CM

## 2019-11-16 LAB — POCT URINALYSIS DIP (DEVICE)
Bilirubin Urine: NEGATIVE
Glucose, UA: NEGATIVE mg/dL
Ketones, ur: NEGATIVE mg/dL
Leukocytes,Ua: NEGATIVE
Nitrite: NEGATIVE
Protein, ur: NEGATIVE mg/dL
Specific Gravity, Urine: >=1.03 (ref 1.005–1.030)
Urobilinogen, UA: 0.2 mg/dL (ref 0.0–1.0)
pH: 5 (ref 5.0–8.0)

## 2019-11-16 LAB — CBC
HCT: 44.6 % (ref 36.0–46.0)
Hemoglobin: 14.6 g/dL (ref 12.0–15.0)
MCH: 30.1 pg (ref 26.0–34.0)
MCHC: 32.7 g/dL (ref 30.0–36.0)
MCV: 92 fL (ref 80.0–100.0)
Platelets: 282 10*3/uL (ref 150–400)
RBC: 4.85 MIL/uL (ref 3.87–5.11)
RDW: 13.2 % (ref 11.5–15.5)
WBC: 7.4 10*3/uL (ref 4.0–10.5)
nRBC: 0 % (ref 0.0–0.2)

## 2019-11-16 LAB — BASIC METABOLIC PANEL
Anion gap: 10 (ref 5–15)
BUN: 13 mg/dL (ref 6–20)
CO2: 24 mmol/L (ref 22–32)
Calcium: 9.8 mg/dL (ref 8.9–10.3)
Chloride: 106 mmol/L (ref 98–111)
Creatinine, Ser: 0.7 mg/dL (ref 0.44–1.00)
GFR calc Af Amer: >60 mL/min (ref >60)
GFR calc non Af Amer: >60 mL/min (ref >60)
Glucose, Bld: 95 mg/dL (ref 70–99)
Potassium: 4 mmol/L (ref 3.5–5.1)
Sodium: 140 mmol/L (ref 135–145)

## 2019-11-16 MED ORDER — AMLODIPINE BESYLATE 10 MG PO TABS
10.0000 mg | ORAL_TABLET | Freq: Every day | ORAL | 0 refills | Status: DC
Start: 2019-11-16 — End: 2021-12-20

## 2019-11-16 NOTE — ED Triage Notes (Signed)
Pt reports acute onset HTN that was detected on a pre-employment screening this morning. Pt denies HA, dizziness, blurred vision, CP or other physical complaint.   Pt states took amlodipine for approx 1 month but stopped taking Rx b/c "she didn't think she was supposed to keep taking it."

## 2019-11-16 NOTE — Discharge Instructions (Addendum)
Begin the amlodipine today.  This is 1 tablet daily  Schedule follow-up with the primary care clinic attached for follow-up on your blood pressure.  If you develop severe headache, blurred vision, dizziness chest pain or shortness of breath please report to emergency department.

## 2019-11-16 NOTE — ED Provider Notes (Addendum)
J[facility]    CSN: [id number] Arrival date & time: 11/16/19  1344      History   Chief Complaint Chief Complaint  Patient presents with  . Hypertension    HPI JAHNI PAUL is a 54 y.o. female.   Patient reports urgent care for evaluation of high blood pressure.  Patient has a known history of hypertension and was previously on amlodipine and started in 03/2019.  She took this for a month however did not have follow-up afterwards and has been off of it since then.  She reports today she was at a employee health screening and was notified she had blood pressure that was too high to continue with the work assessment.  She was instructed to go be evaluated by a medical provider.  Patient does not have a primary care and has not had regular care outside of urgent care visits.  Today in clinic she denies headache, blurred vision, dizziness, chest pain, shortness of breath.  Denies any numbness or tingling.      History reviewed. No pertinent past medical history.  There are no problems to display for this patient.   History reviewed. No pertinent surgical history.  OB History   No obstetric history on file.      Home Medications    Prior to Admission medications   Medication Sig Start Date End Date Taking? Authorizing Provider  albuterol (PROAIR HFA) 108 (90 Base) MCG/ACT inhaler Inhale 1-2 puffs into the lungs every 6 (six) hours as needed for wheezing or shortness of breath. 01/15/19   Zigmund Gottron, NP  amLODipine (NORVASC) 10 MG tablet Take 1 tablet (10 mg total) by mouth daily. 11/16/19   Sherrey North, Marguerita Beards, PA-C  benzonatate (TESSALON) 100 MG capsule Take 1 capsule (100 mg total) by mouth every 8 (eight) hours. 04/03/19   Vanessa Kick, MD  chlorpheniramine-HYDROcodone (TUSSIONEX PENNKINETIC ER) 10-8 MG/5ML LQCR Take 5 mLs by mouth at bedtime as needed. 07/01/11   Chauncy Passy, MD  diphenhydrAMINE (BENADRYL) 50 MG capsule Take 50 mg by mouth every 6 (six) hours as  needed.    [provider]  guaiFENesin (MUCINEX) 600 MG 12 hr tablet Take by mouth 2 (two) times daily.    [provider]  predniSONE (STERAPRED UNI-PAK 21 TAB) 10 MG (21) TBPK tablet Take by mouth daily. Take as directed. 04/03/19   Vanessa Kick, MD  Tetrahydroz-Polyvinyl Al-Povid (CLEAR EYES TRIPLE ACTION) 0.05-0.5-0.6 % SOLN Apply 2 drops to eye once.      [provider]  Zinc 15 MG CAPS Take 1 capsule by mouth 4 (four) times daily.      [provider]    Family History Family History  Problem Relation Age of Onset  . Hypertension Mother     Social History Social History   Tobacco Use  . Smoking status: Current Every Day Smoker    Types: Cigarettes  . Smokeless tobacco: Never Used  Substance Use Topics  . Alcohol use: No  . Drug use: No     Allergies   Avocado and Shellfish allergy   Review of Systems Review of Systems  Per HPI Physical Exam Triage Vital Signs ED Triage Vitals  Enc Vitals Group     BP 11/16/19 1432 (!) 197/123     Pulse Rate 11/16/19 1432 87     Resp 11/16/19 1432 16     Temp 11/16/19 1432 97.8 F (36.6 C)     Temp Source 11/16/19 1432  Oral     SpO2 11/16/19 1432 99 %     Weight --      Height --      Head Circumference --      Peak Flow --      Pain Score 11/16/19 1433 0     Pain Loc --      Pain Edu? --      Excl. in GC? --    No data found.  Updated Vital Signs BP (!) 197/123 (BP Location: Left Arm)   Pulse 87   Temp 97.8 F (36.6 C) (Oral)   Resp 16   SpO2 99%   Visual Acuity Right Eye Distance:   Left Eye Distance:   Bilateral Distance:    Right Eye Near:   Left Eye Near:    Bilateral Near:     Physical Exam Vitals and nursing note reviewed.  Constitutional:      General: She is not in acute distress.    Appearance: She is well-developed.  HENT:     Head: Normocephalic and atraumatic.  Eyes:     Conjunctiva/sclera: Conjunctivae normal.  Cardiovascular:     Rate and  Rhythm: Normal rate and regular rhythm.     Heart sounds: No murmur.  Pulmonary:     Effort: Pulmonary effort is normal. No respiratory distress.     Breath sounds: Normal breath sounds.  Musculoskeletal:     Cervical back: Neck supple.  Skin:    General: Skin is warm and dry.  Neurological:     General: No focal deficit present.     Mental Status: She is alert and oriented to person, place, and time.      UC Treatments / Results  Labs (all labs ordered are listed, but only abnormal results are displayed) Labs Reviewed  POCT URINALYSIS DIP (DEVICE) - Abnormal; Notable for the following components:      Result Value   Hgb urine dipstick SMALL (*)    All other components within normal limits  CBC  BASIC METABOLIC PANEL    EKG   Radiology No results found.  Procedures Procedures (including critical care time)  Medications Ordered in UC Medications - No data to display  Initial Impression / Assessment and Plan / UC Course  I have reviewed the triage vital signs and the nursing notes.  Pertinent labs & imaging results that were available during my care of the patient were reviewed by me and considered in my medical decision making (see chart for details).  Previous notes and vitals reviewed.    #Uncontrolled hypertension Patient is a 54 year old with history of hypertension presenting with uncontrolled hypertension.  She is asymptomatic from a hypertension standpoint today.  She was previously on 5 mg of amlodipine for 1 month, tolerated this well.  However does not have a recheck of her blood pressure at that time to evaluate response.  CBC, BMP and UA without concerning findings.  Creatinine 0.7.  No proteinuria.  Started on 10 mg amlodipine and given primary care follow-up for continued management of blood pressure..  Strict emergency department return precautions were discussed with patient.  She verbalizes understanding of the plan. Final Clinical Impressions(s) / UC  Diagnoses   Final diagnoses:  Uncontrolled hypertension     Discharge Instructions     Begin the amlodipine today.  This is 1 tablet daily  Schedule follow-up with the primary care clinic attached for follow-up on your blood pressure.  If you develop severe headache, blurred  vision, dizziness chest pain or shortness of breath please report to emergency department.    ED Prescriptions    Medication Sig Dispense Auth. Provider   amLODipine (NORVASC) 10 MG tablet Take 1 tablet (10 mg total) by mouth daily. 30 tablet Chloeanne Poteet, Veryl Speak, PA-C     PDMP not reviewed this encounter.   Hermelinda Medicus, PA-C 11/16/19 2102    Sasha Rogel, Veryl Speak, PA-C 11/16/19 2105

## 2020-02-12 ENCOUNTER — Other Ambulatory Visit: Payer: Self-pay

## 2020-02-12 DIAGNOSIS — Z20822 Contact with and (suspected) exposure to covid-19: Secondary | ICD-10-CM

## 2020-02-13 LAB — SARS-COV-2, NAA 2 DAY TAT

## 2020-02-13 LAB — NOVEL CORONAVIRUS, NAA: SARS-CoV-2, NAA: NOT DETECTED

## 2021-01-15 IMAGING — DX CHEST - 2 VIEW
2 series · 2 of 2 positions shown · non-contrast
Comparison: 05/01/2011

CLINICAL DATA: Productive cough for 2 months

EXAM:
CHEST - 2 VIEW

[chest pa]
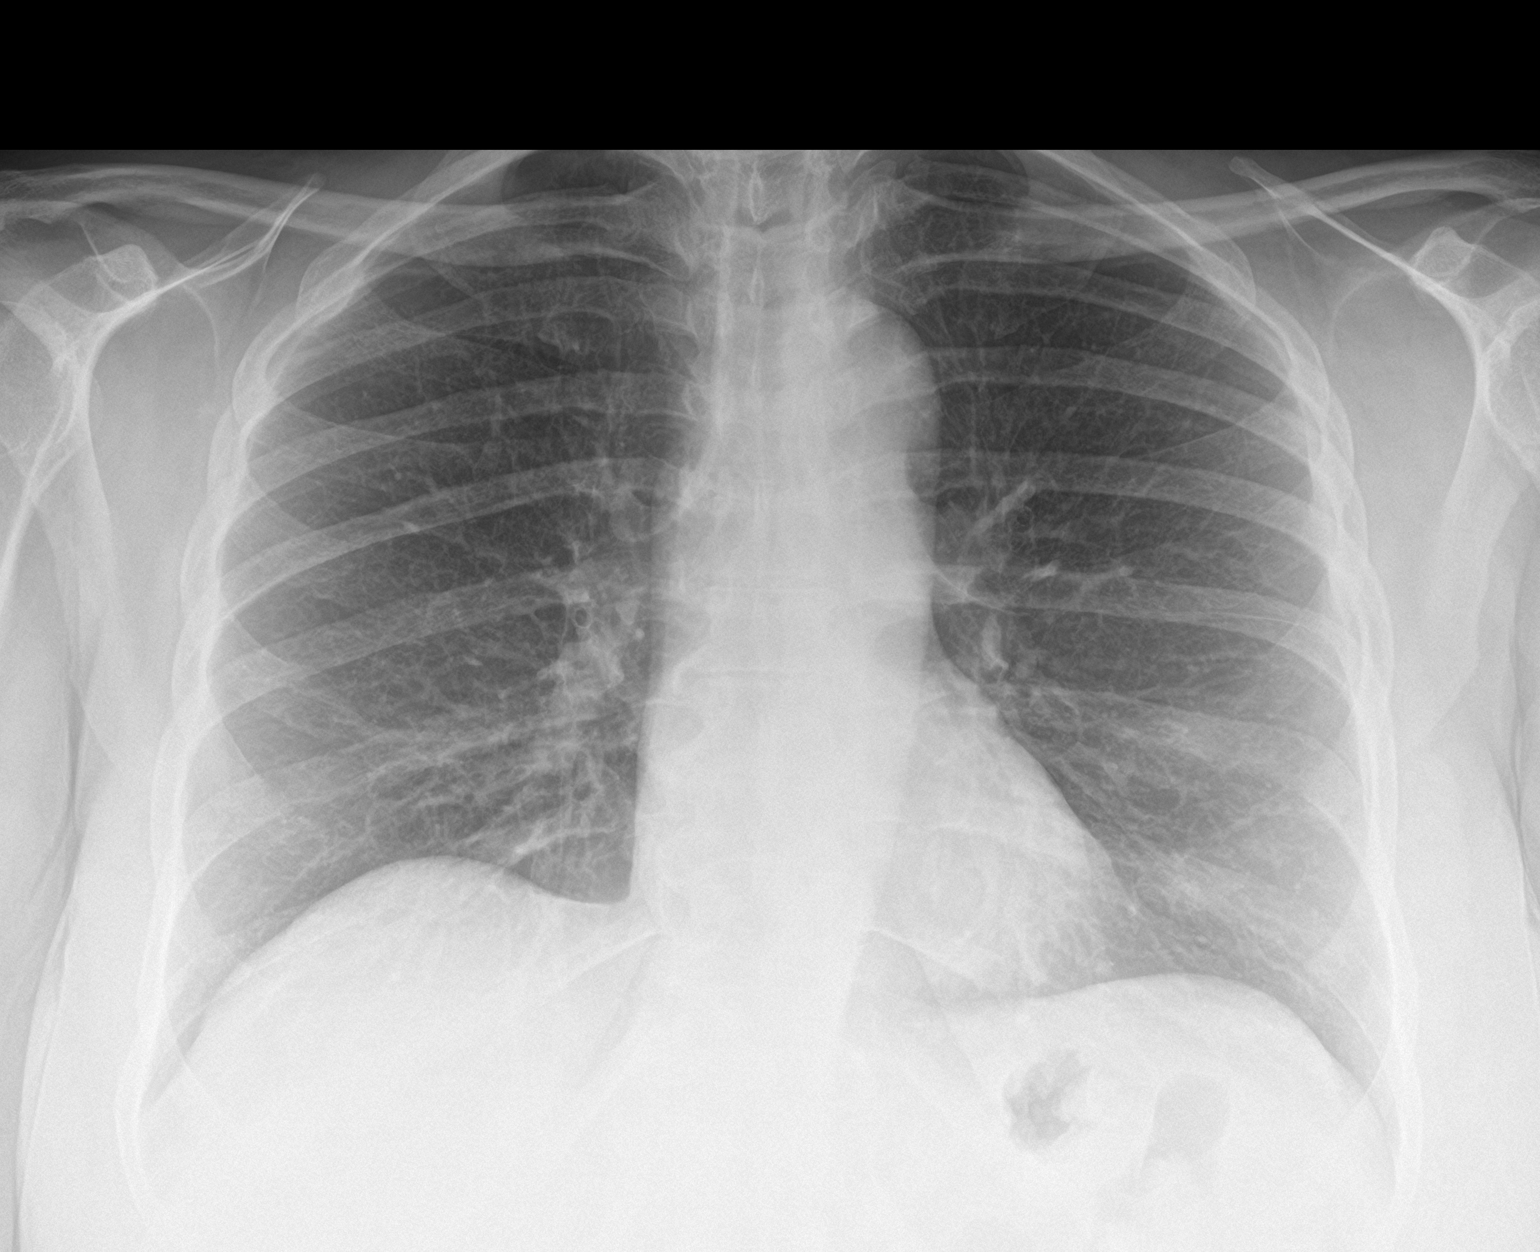

[chest lat]
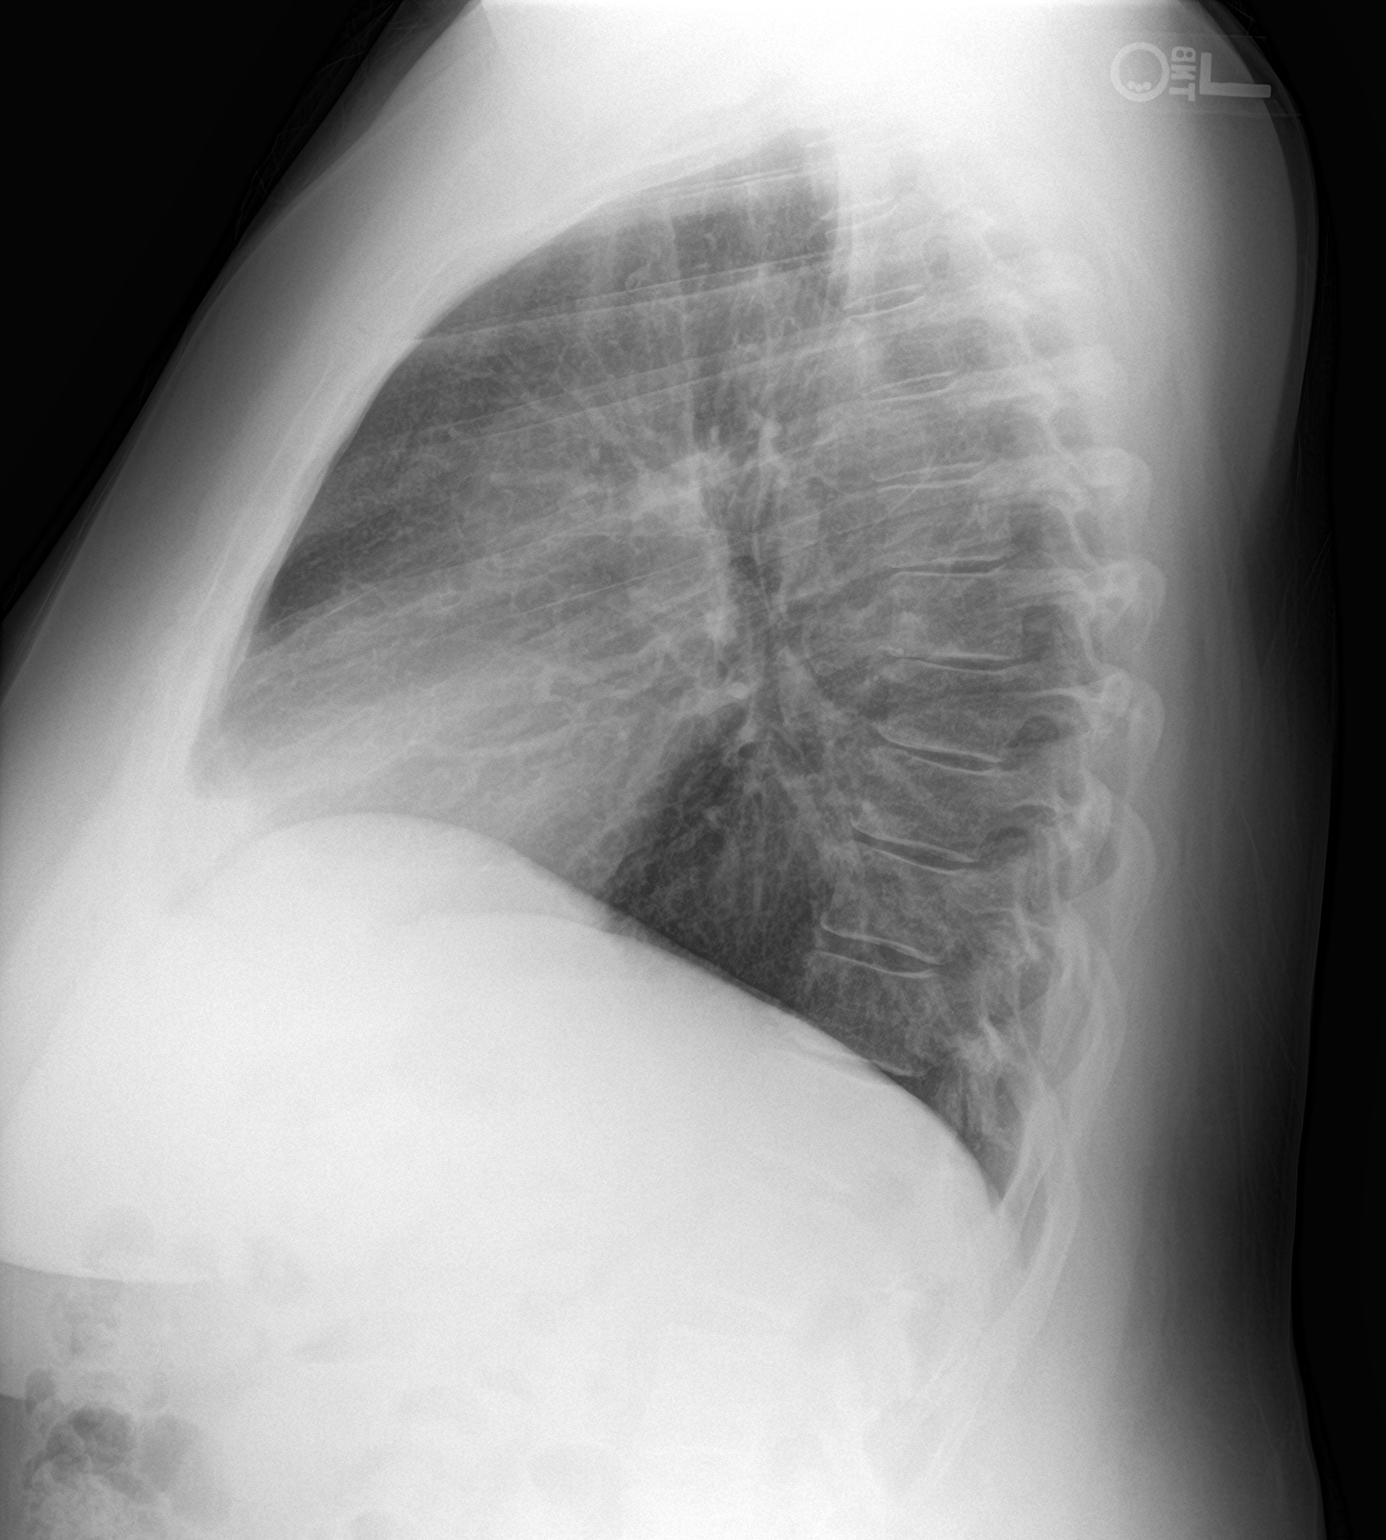

[2 of 2 positions shown; findings below may reference images not displayed]

FINDINGS: The heart size and mediastinal contours are within normal limits.
Both lungs are clear. The visualized skeletal structures are
unremarkable.
IMPRESSION: No active cardiopulmonary disease.

## 2021-12-10 ENCOUNTER — Encounter (HOSPITAL_COMMUNITY): Payer: Self-pay

## 2021-12-10 ENCOUNTER — Ambulatory Visit (HOSPITAL_COMMUNITY)
Admission: EM | Admit: 2021-12-10 | Discharge: 2021-12-10 | Disposition: A | Discharge status: Home / Self Care | Payer: BC Managed Care – PPO | Attending: Emergency Medicine | Admitting: Emergency Medicine

## 2021-12-10 DIAGNOSIS — L03115 Cellulitis of right lower limb: Secondary | ICD-10-CM

## 2021-12-10 HISTORY — DX: Cellulitis of right lower limb: L03.115

## 2021-12-10 MED ORDER — DOXYCYCLINE HYCLATE 100 MG PO CAPS: 100.0000 mg | ORAL_CAPSULE | Freq: Two times a day (BID) | ORAL | 0 refills | Status: DC

## 2021-12-10 MED ORDER — OXYCODONE HCL 5 MG PO TABS: 5.0000 mg | ORAL_TABLET | Freq: Four times a day (QID) | ORAL | 0 refills | Status: DC | PRN

## 2021-12-10 NOTE — Discharge Instructions (Addendum)
Today you are being treated for an infection of the soft tissues of your leg  Begin use of doxycycline twice daily for the next 10 days, daily should start to see improvement in the next 24 to 48 hours and steady progression from finger  You may elevate extremity onto pillows while sitting and lying to help facilitate reduction of swelling  You may hold ice or heat over the affected area for general comfort  You may use Tylenol or ibuprofen every 6 hours as needed for comfort  You may use oxycodone every 6 hours for severe pain, be mindful this medication may make you drowsy  If you do not feel like there is any improvement once starting your antibiotic, if the swelling continues to move up, if you begin to have fever or chills please go to the nearest emergency department for reevaluation and intravenous antibiotics  If you complete your antibiotics and still have concerns about your legs you may return to urgent care for evaluation

## 2021-12-10 NOTE — ED Triage Notes (Signed)
Pain and swelling in the right leg. Happened before 3 months ago but went away by itself.  Nothing helping the pain or swelling. No falls, no known injury. Onset 6 days ago.

## 2021-12-10 NOTE — ED Provider Notes (Signed)
Teresa Phillips    CSN: [id number] Arrival date & time: 12/10/21  1832      History   Chief Complaint Chief Complaint  Patient presents with   Leg Swelling   Leg Pain    HPI Teresa Phillips is a 56 y.o. female.   Patient presents with right lower extremity swelling, pain erythema for 6 days.  Dors is over the course of the 6 days she noticed a blister to her calf which increased in size has opened and drained.  It is painful to bear weight.  Range of motion is intact.  Has attempted use of Tylenol, ibuprofen which has been ineffective.  Denies numbness, tingling, prior injury or trauma.  History reviewed. No pertinent past medical history.  There are no problems to display for this patient.   History reviewed. No pertinent surgical history.  OB History   No obstetric history on file.      Home Medications    Prior to Admission medications   Medication Sig Start Date End Date Taking? Authorizing Provider  doxycycline (VIBRAMYCIN) 100 MG capsule Take 1 capsule (100 mg total) by mouth 2 (two) times daily for 10 days. 12/10/21 12/20/21 Yes Nakanishi, Violette Morneault R, NP  oxyCODONE (ROXICODONE) 5 MG immediate release tablet Take 1 tablet (5 mg total) by mouth every 6 (six) hours as needed for severe pain. 12/10/21  Yes Spindler, Coleby Yett R, NP  albuterol (PROAIR HFA) 108 (90 Base) MCG/ACT inhaler Inhale 1-2 puffs into the lungs every 6 (six) hours as needed for wheezing or shortness of breath. 01/15/19   Georgetta HaberBurky, Natalie B, NP  amLODipine (NORVASC) 10 MG tablet Take 1 tablet (10 mg total) by mouth daily. 11/16/19   Darr, Gerilyn PilgrimJacob, PA-C  benzonatate (TESSALON) 100 MG capsule Take 1 capsule (100 mg total) by mouth every 8 (eight) hours. 04/03/19   Mardella LaymanHagler, Brian, MD  chlorpheniramine-HYDROcodone (TUSSIONEX PENNKINETIC ER) 10-8 MG/5ML LQCR Take 5 mLs by mouth at bedtime as needed. 07/01/11   Cyndra NumbersHunt, Meagan, MD  diphenhydrAMINE (BENADRYL) 50 MG capsule Take 50 mg by mouth every 6 (six) hours as needed.     [provider]  guaiFENesin (MUCINEX) 600 MG 12 hr tablet Take by mouth 2 (two) times daily.    [provider]  predniSONE (STERAPRED UNI-PAK 21 TAB) 10 MG (21) TBPK tablet Take by mouth daily. Take as directed. 04/03/19   Mardella LaymanHagler, Brian, MD  Tetrahydroz-Polyvinyl Al-Povid (CLEAR EYES TRIPLE ACTION) 0.05-0.5-0.6 % SOLN Apply 2 drops to eye once.      [provider]  Zinc 15 MG CAPS Take 1 capsule by mouth 4 (four) times daily.      [provider]    Family History Family History  Problem Relation Age of Onset   Hypertension Mother     Social History Social History   Tobacco Use   Smoking status: Every Day    Types: Cigarettes   Smokeless tobacco: Never  Substance Use Topics   Alcohol use: No   Drug use: No     Allergies   Avocado and Shellfish allergy   Review of Systems Review of Systems  Constitutional: Negative.   Respiratory: Negative.    Cardiovascular:  Positive for leg swelling. Negative for chest pain and palpitations.  Musculoskeletal: Negative.   Neurological: Negative.     Physical Exam Triage Vital Signs ED Triage Vitals  Enc Vitals Group     BP 12/10/21 1946 129/86     Pulse Rate 12/10/21  1946 96     Resp 12/10/21 1946 16     Temp 12/10/21 1946 98.1 F (36.7 C)     Temp Source 12/10/21 1946 Oral     SpO2 --      Weight 12/10/21 1945 242 lb (109.8 kg)     Height 12/10/21 1945 5\' 8"  (1.727 m)     Head Circumference --      Peak Flow --      Pain Score 12/10/21 1945 8     Pain Loc --      Pain Edu? --      Excl. in GC? --    No data found.  Updated Vital Signs BP 129/86 (BP Location: Right Arm)   Pulse 96   Temp 98.1 F (36.7 C) (Oral)   Resp 16   Ht 5\' 8"  (1.727 m)   Wt 242 lb (109.8 kg)   BMI 36.80 kg/m   Visual Acuity Right Eye Distance:   Left Eye Distance:   Bilateral Distance:    Right Eye Near:   Left Eye Near:    Bilateral Near:     Physical Exam Constitutional:      Appearance:  Normal appearance.  HENT:     Head: Normocephalic.  Eyes:     Extraocular Movements: Extraocular movements intact.  Pulmonary:     Effort: Pulmonary effort is normal.  Skin:    Comments: 2+ pitting edema to the right lower extremity with erythema extending along the anterior and posterior leg, open wound present to the posterior calf, tender to palpation, 2+ popliteal and dorsalis pedis pulse, sensation intact, range of motion intact  Neurological:     Mental Status: She is alert and oriented to person, place, and time. Mental status is at baseline.  Psychiatric:        Mood and Affect: Mood normal.        Behavior: Behavior normal.        UC Treatments / Results  Labs (all labs ordered are listed, but only abnormal results are displayed) Labs Reviewed - No data to display  EKG   Radiology No results found.  Procedures Procedures (including critical care time)  Medications Ordered in UC Medications - No data to display  Initial Impression / Assessment and Plan / UC Course  I have reviewed the triage vital signs and the nursing notes.  Pertinent labs & imaging results that were available during my care of the patient were reviewed by me and considered in my medical decision making (see chart for details).  Cellulitis of leg, right  Vital signs are stable, no signs of sepsis at this time, placed on doxycycline twice a day for 10 days, recommended elevation, warm compresses, continued use of Tylenol and ibuprofen for management of discomfort, oxycodone prescribed for severe pain, PDMP reviewed, low risk, patient given strict precautions that if no improvement seen within 3 days use of oral antibiotics that she is to go to the nearest emergency department for further evaluation and intravenous medications, may follow-up with this urgent care for reevaluation of leg as needed as patient has no PCP Final Clinical Impressions(s) / UC Diagnoses   Final diagnoses:  Cellulitis  of leg, right     Discharge Instructions      Today you are being treated for an infection of the soft tissues of your leg  Begin use of doxycycline twice daily for the next 10 days, daily should start to see improvement in the next 24  to 48 hours and steady progression from finger  You may elevate extremity onto pillows while sitting and lying to help facilitate reduction of swelling  You may hold ice or heat over the affected area for general comfort  You may use Tylenol or ibuprofen every 6 hours as needed for comfort  You may use oxycodone every 6 hours for severe pain, be mindful this medication may make you drowsy  If you do not feel like there is any improvement once starting your antibiotic, if the swelling continues to move up, if you begin to have fever or chills please go to the nearest emergency department for reevaluation and intravenous antibiotics  If you complete your antibiotics and still have concerns about your legs you may return to urgent care for evaluation       ED Prescriptions     Medication Sig Dispense Auth. Provider   doxycycline (VIBRAMYCIN) 100 MG capsule Take 1 capsule (100 mg total) by mouth 2 (two) times daily for 10 days. 20 capsule Godshall, Katalena Malveaux R, NP   oxyCODONE (ROXICODONE) 5 MG immediate release tablet Take 1 tablet (5 mg total) by mouth every 6 (six) hours as needed for severe pain. 30 tablet Cheeks, Elita Boone, NP      I have reviewed the PDMP during this encounter.   Levia, [place], [place] 12/14/21 [phone]

## 2021-12-11 ENCOUNTER — Telehealth (HOSPITAL_COMMUNITY): Payer: Self-pay | Admitting: *Deleted

## 2021-12-11 NOTE — Telephone Encounter (Signed)
This writer attempted to return TC to this PT. Voice message reporting voice mailbox full. Unable to ask which pharmacy meds should be called to.

## 2021-12-15 ENCOUNTER — Telehealth (HOSPITAL_COMMUNITY): Payer: Self-pay | Admitting: Emergency Medicine

## 2021-12-15 MED ORDER — TRAMADOL HCL 50 MG PO TABS: 50.0000 mg | ORAL_TABLET | Freq: Four times a day (QID) | ORAL | 0 refills | Status: DC | PRN

## 2021-12-15 NOTE — Telephone Encounter (Signed)
Patient called and states the pharmacy does not have Oxycodone and no other pharmacy will tell her if they have any in stock.  Is looking for an alternative.  Reviewed with Hansel Starling, APP, and she okay'd switch to Tramadol.   Reviewed with patient, verified pharmacy, cancelled Oxycodone prescription with [facility] and Adrienne sent Tramadol.

## 2021-12-20 ENCOUNTER — Inpatient Hospital Stay (HOSPITAL_COMMUNITY)
Admission: EM | Admit: 2021-12-20 | Discharge: 2021-12-25 | DRG: 378 | Disposition: A | Discharge status: Home / Self Care | Payer: BC Managed Care – PPO | Attending: Internal Medicine | Admitting: Internal Medicine

## 2021-12-20 ENCOUNTER — Emergency Department (HOSPITAL_COMMUNITY): Payer: BC Managed Care – PPO

## 2021-12-20 ENCOUNTER — Inpatient Hospital Stay (HOSPITAL_COMMUNITY): Payer: BC Managed Care – PPO

## 2021-12-20 ENCOUNTER — Encounter (HOSPITAL_COMMUNITY): Payer: Self-pay | Admitting: Emergency Medicine

## 2021-12-20 ENCOUNTER — Other Ambulatory Visit: Payer: Self-pay

## 2021-12-20 DIAGNOSIS — Z91018 Allergy to other foods: Secondary | ICD-10-CM | POA: Diagnosis not present

## 2021-12-20 DIAGNOSIS — K3189 Other diseases of stomach and duodenum: Secondary | ICD-10-CM | POA: Diagnosis present

## 2021-12-20 DIAGNOSIS — Z79899 Other long term (current) drug therapy: Secondary | ICD-10-CM | POA: Diagnosis not present

## 2021-12-20 DIAGNOSIS — M79604 Pain in right leg: Secondary | ICD-10-CM

## 2021-12-20 DIAGNOSIS — Z713 Dietary counseling and surveillance: Secondary | ICD-10-CM

## 2021-12-20 DIAGNOSIS — Z7952 Long term (current) use of systemic steroids: Secondary | ICD-10-CM

## 2021-12-20 DIAGNOSIS — Z8249 Family history of ischemic heart disease and other diseases of the circulatory system: Secondary | ICD-10-CM

## 2021-12-20 DIAGNOSIS — K635 Polyp of colon: Secondary | ICD-10-CM | POA: Diagnosis present

## 2021-12-20 DIAGNOSIS — Z91013 Allergy to seafood: Secondary | ICD-10-CM | POA: Diagnosis not present

## 2021-12-20 DIAGNOSIS — D649 Anemia, unspecified: Secondary | ICD-10-CM | POA: Diagnosis not present

## 2021-12-20 DIAGNOSIS — E8809 Other disorders of plasma-protein metabolism, not elsewhere classified: Secondary | ICD-10-CM | POA: Diagnosis present

## 2021-12-20 DIAGNOSIS — K621 Rectal polyp: Secondary | ICD-10-CM | POA: Diagnosis present

## 2021-12-20 DIAGNOSIS — K625 Hemorrhage of anus and rectum: Secondary | ICD-10-CM | POA: Diagnosis not present

## 2021-12-20 DIAGNOSIS — R600 Localized edema: Secondary | ICD-10-CM | POA: Diagnosis not present

## 2021-12-20 DIAGNOSIS — T39395A Adverse effect of other nonsteroidal anti-inflammatory drugs [NSAID], initial encounter: Secondary | ICD-10-CM | POA: Diagnosis present

## 2021-12-20 DIAGNOSIS — I10 Essential (primary) hypertension: Secondary | ICD-10-CM | POA: Diagnosis present

## 2021-12-20 DIAGNOSIS — K222 Esophageal obstruction: Secondary | ICD-10-CM | POA: Diagnosis present

## 2021-12-20 DIAGNOSIS — E876 Hypokalemia: Secondary | ICD-10-CM

## 2021-12-20 DIAGNOSIS — K5731 Diverticulosis of large intestine without perforation or abscess with bleeding: Principal | ICD-10-CM | POA: Diagnosis present

## 2021-12-20 DIAGNOSIS — F17210 Nicotine dependence, cigarettes, uncomplicated: Secondary | ICD-10-CM | POA: Diagnosis present

## 2021-12-20 DIAGNOSIS — D75838 Other thrombocytosis: Secondary | ICD-10-CM | POA: Diagnosis present

## 2021-12-20 DIAGNOSIS — E669 Obesity, unspecified: Secondary | ICD-10-CM | POA: Diagnosis present

## 2021-12-20 DIAGNOSIS — L03115 Cellulitis of right lower limb: Secondary | ICD-10-CM | POA: Diagnosis present

## 2021-12-20 DIAGNOSIS — D62 Acute posthemorrhagic anemia: Secondary | ICD-10-CM | POA: Diagnosis present

## 2021-12-20 DIAGNOSIS — M7989 Other specified soft tissue disorders: Secondary | ICD-10-CM | POA: Diagnosis not present

## 2021-12-20 DIAGNOSIS — D75839 Thrombocytosis, unspecified: Secondary | ICD-10-CM

## 2021-12-20 DIAGNOSIS — Z79891 Long term (current) use of opiate analgesic: Secondary | ICD-10-CM | POA: Diagnosis not present

## 2021-12-20 DIAGNOSIS — K922 Gastrointestinal hemorrhage, unspecified: Secondary | ICD-10-CM | POA: Diagnosis present

## 2021-12-20 DIAGNOSIS — Z720 Tobacco use: Secondary | ICD-10-CM | POA: Diagnosis not present

## 2021-12-20 DIAGNOSIS — Z716 Tobacco abuse counseling: Secondary | ICD-10-CM | POA: Diagnosis not present

## 2021-12-20 DIAGNOSIS — K259 Gastric ulcer, unspecified as acute or chronic, without hemorrhage or perforation: Secondary | ICD-10-CM | POA: Diagnosis present

## 2021-12-20 DIAGNOSIS — N179 Acute kidney failure, unspecified: Secondary | ICD-10-CM | POA: Diagnosis present

## 2021-12-20 DIAGNOSIS — L538 Other specified erythematous conditions: Secondary | ICD-10-CM

## 2021-12-20 DIAGNOSIS — K449 Diaphragmatic hernia without obstruction or gangrene: Secondary | ICD-10-CM | POA: Diagnosis present

## 2021-12-20 DIAGNOSIS — Z6836 Body mass index (BMI) 36.0-36.9, adult: Secondary | ICD-10-CM

## 2021-12-20 HISTORY — DX: Hypokalemia: E87.6

## 2021-12-20 LAB — BLOOD GAS, VENOUS
Acid-base deficit: 2.9 mmol/L — ABNORMAL HIGH (ref 0.0–2.0)
Bicarbonate: 22.6 mmol/L (ref 20.0–28.0)
O2 Saturation: 37.5 %
Patient temperature: 37.1
pCO2, Ven: 41 mmHg — ABNORMAL LOW (ref 44–60)
pH, Ven: 7.35 (ref 7.25–7.43)
pO2, Ven: <31 mmHg — CL (ref 32–45)

## 2021-12-20 LAB — CK: Total CK: 64 U/L (ref 38–234)

## 2021-12-20 LAB — COMPREHENSIVE METABOLIC PANEL
ALT: 10 U/L (ref 0–44)
AST: 13 U/L — ABNORMAL LOW (ref 15–41)
Albumin: 2.7 g/dL — ABNORMAL LOW (ref 3.5–5.0)
Alkaline Phosphatase: 71 U/L (ref 38–126)
Anion gap: 8 (ref 5–15)
BUN: 26 mg/dL — ABNORMAL HIGH (ref 6–20)
CO2: 20 mmol/L — ABNORMAL LOW (ref 22–32)
Calcium: 9.1 mg/dL (ref 8.9–10.3)
Chloride: 114 mmol/L — ABNORMAL HIGH (ref 98–111)
Creatinine, Ser: 1.01 mg/dL — ABNORMAL HIGH (ref 0.44–1.00)
GFR, Estimated: >60 mL/min (ref >60)
Glucose, Bld: 112 mg/dL — ABNORMAL HIGH (ref 70–99)
Potassium: 2.8 mmol/L — ABNORMAL LOW (ref 3.5–5.1)
Sodium: 142 mmol/L (ref 135–145)
Total Bilirubin: 0.6 mg/dL (ref 0.3–1.2)
Total Protein: 7.4 g/dL (ref 6.5–8.1)

## 2021-12-20 LAB — CBC WITH DIFFERENTIAL/PLATELET
Abs Immature Granulocytes: 0.04 10*3/uL (ref 0.00–0.07)
Basophils Absolute: 0.1 10*3/uL (ref 0.0–0.1)
Basophils Relative: 1 %
Eosinophils Absolute: 0.1 10*3/uL (ref 0.0–0.5)
Eosinophils Relative: 1 %
HCT: 24.6 % — ABNORMAL LOW (ref 36.0–46.0)
Hemoglobin: 7.8 g/dL — ABNORMAL LOW (ref 12.0–15.0)
Immature Granulocytes: 0 %
Lymphocytes Relative: 12 %
Lymphs Abs: 1.5 10*3/uL (ref 0.7–4.0)
MCH: 29.4 pg (ref 26.0–34.0)
MCHC: 31.7 g/dL (ref 30.0–36.0)
MCV: 92.8 fL (ref 80.0–100.0)
Monocytes Absolute: 0.8 10*3/uL (ref 0.1–1.0)
Monocytes Relative: 7 %
Neutro Abs: 9.5 10*3/uL — ABNORMAL HIGH (ref 1.7–7.7)
Neutrophils Relative %: 79 %
Platelets: 549 10*3/uL — ABNORMAL HIGH (ref 150–400)
RBC: 2.65 MIL/uL — ABNORMAL LOW (ref 3.87–5.11)
RDW: 14.7 % (ref 11.5–15.5)
WBC: 12 10*3/uL — ABNORMAL HIGH (ref 4.0–10.5)
nRBC: 0 % (ref 0.0–0.2)

## 2021-12-20 LAB — PREPARE RBC (CROSSMATCH)

## 2021-12-20 LAB — PROTIME-INR
INR: 1.2 (ref 0.8–1.2)
Prothrombin Time: 15.3 seconds — ABNORMAL HIGH (ref 11.4–15.2)

## 2021-12-20 LAB — BASIC METABOLIC PANEL
Anion gap: 6 (ref 5–15)
BUN: 26 mg/dL — ABNORMAL HIGH (ref 6–20)
CO2: 22 mmol/L (ref 22–32)
Calcium: 8.7 mg/dL — ABNORMAL LOW (ref 8.9–10.3)
Chloride: 114 mmol/L — ABNORMAL HIGH (ref 98–111)
Creatinine, Ser: 0.83 mg/dL (ref 0.44–1.00)
GFR, Estimated: >60 mL/min (ref >60)
Glucose, Bld: 107 mg/dL — ABNORMAL HIGH (ref 70–99)
Potassium: 2.8 mmol/L — ABNORMAL LOW (ref 3.5–5.1)
Sodium: 142 mmol/L (ref 135–145)

## 2021-12-20 LAB — TSH: TSH: 0.688 u[IU]/mL (ref 0.350–4.500)

## 2021-12-20 LAB — CBC
HCT: 22 % — ABNORMAL LOW (ref 36.0–46.0)
Hemoglobin: 7.1 g/dL — ABNORMAL LOW (ref 12.0–15.0)
MCH: 29.7 pg (ref 26.0–34.0)
MCHC: 32.3 g/dL (ref 30.0–36.0)
MCV: 92.1 fL (ref 80.0–100.0)
Platelets: 522 10*3/uL — ABNORMAL HIGH (ref 150–400)
RBC: 2.39 MIL/uL — ABNORMAL LOW (ref 3.87–5.11)
RDW: 14.6 % (ref 11.5–15.5)
WBC: 9.5 10*3/uL (ref 4.0–10.5)
nRBC: 0 % (ref 0.0–0.2)

## 2021-12-20 LAB — MRSA NEXT GEN BY PCR, NASAL: MRSA by PCR Next Gen: NOT DETECTED

## 2021-12-20 LAB — SEDIMENTATION RATE: Sed Rate: >150 mm/hr — ABNORMAL HIGH (ref 0–22)

## 2021-12-20 LAB — ETHANOL: Alcohol, Ethyl (B): <10 mg/dL (ref <10)

## 2021-12-20 LAB — RETICULOCYTES
Immature Retic Fract: 14 % (ref 2.3–15.9)
RBC.: 2.39 MIL/uL — ABNORMAL LOW (ref 3.87–5.11)
Retic Count, Absolute: 37.3 10*3/uL (ref 19.0–186.0)
Retic Ct Pct: 1.6 % (ref 0.4–3.1)

## 2021-12-20 LAB — FERRITIN: Ferritin: 191 ng/mL (ref 11–307)

## 2021-12-20 LAB — ABO/RH: ABO/RH(D): O POS

## 2021-12-20 LAB — LACTIC ACID, PLASMA: Lactic Acid, Venous: 0.9 mmol/L (ref 0.5–1.9)

## 2021-12-20 LAB — IRON AND TIBC
Iron: 21 ug/dL — ABNORMAL LOW (ref 28–170)
Saturation Ratios: 11 % (ref 10.4–31.8)
TIBC: 193 ug/dL — ABNORMAL LOW (ref 250–450)
UIBC: 172 ug/dL

## 2021-12-20 LAB — PROCALCITONIN: Procalcitonin: <0.1 ng/mL

## 2021-12-20 LAB — MAGNESIUM: Magnesium: 1.8 mg/dL (ref 1.7–2.4)

## 2021-12-20 LAB — VITAMIN B12: Vitamin B-12: 561 pg/mL (ref 180–914)

## 2021-12-20 LAB — PHOSPHORUS: Phosphorus: 3.9 mg/dL (ref 2.5–4.6)

## 2021-12-20 LAB — FOLATE: Folate: 10.5 ng/mL (ref >5.9)

## 2021-12-20 MED ORDER — PANTOPRAZOLE SODIUM 40 MG IV SOLR
40.0000 mg | Freq: Two times a day (BID) | INTRAVENOUS | Status: DC
  Administered 2021-12-20 – 2021-12-22 (×5): 40 mg via INTRAVENOUS
  Filled 2021-12-20 (×4): qty 10

## 2021-12-20 MED ORDER — SODIUM CHLORIDE 0.9% IV SOLUTION: Freq: Once | INTRAVENOUS | Status: AC

## 2021-12-20 MED ORDER — SODIUM CHLORIDE 0.9 % IV SOLN: INTRAVENOUS | Status: DC

## 2021-12-20 MED ORDER — ACETAMINOPHEN 650 MG RE SUPP: 650.0000 mg | Freq: Four times a day (QID) | RECTAL | Status: DC | PRN

## 2021-12-20 MED ORDER — POTASSIUM CHLORIDE CRYS ER 20 MEQ PO TBCR
40.0000 meq | EXTENDED_RELEASE_TABLET | Freq: Once | ORAL | Status: AC
  Administered 2021-12-20: 40 meq via ORAL
  Filled 2021-12-20: qty 2

## 2021-12-20 MED ORDER — VANCOMYCIN HCL IN DEXTROSE 1-5 GM/200ML-% IV SOLN: 1000.0000 mg | Freq: Once | INTRAVENOUS | Status: DC

## 2021-12-20 MED ORDER — ACETAMINOPHEN 325 MG PO TABS: 650.0000 mg | ORAL_TABLET | Freq: Four times a day (QID) | ORAL | Status: DC | PRN

## 2021-12-20 MED ORDER — VANCOMYCIN HCL 2000 MG/400ML IV SOLN
2000.0000 mg | Freq: Once | INTRAVENOUS | Status: AC
  Administered 2021-12-20: 2000 mg via INTRAVENOUS
  Filled 2021-12-20: qty 400

## 2021-12-20 MED ORDER — FENTANYL CITRATE PF 50 MCG/ML IJ SOSY
50.0000 ug | PREFILLED_SYRINGE | INTRAMUSCULAR | Status: DC | PRN
  Administered 2021-12-21 – 2021-12-22 (×2): 50 ug via INTRAVENOUS
  Filled 2021-12-20 (×2): qty 1

## 2021-12-20 MED ORDER — NICOTINE 14 MG/24HR TD PT24
14.0000 mg | MEDICATED_PATCH | Freq: Every day | TRANSDERMAL | Status: DC
  Administered 2021-12-20 – 2021-12-25 (×6): 14 mg via TRANSDERMAL
  Filled 2021-12-20 (×6): qty 1

## 2021-12-20 MED ORDER — VANCOMYCIN HCL 1500 MG/300ML IV SOLN: 1500.0000 mg | INTRAVENOUS | Status: DC

## 2021-12-20 MED ORDER — SODIUM CHLORIDE 0.9 % IV SOLN
2.0000 g | Freq: Three times a day (TID) | INTRAVENOUS | Status: DC
  Administered 2021-12-21: 2 g via INTRAVENOUS
  Filled 2021-12-20: qty 12.5

## 2021-12-20 MED ORDER — HYDROCODONE-ACETAMINOPHEN 5-325 MG PO TABS
1.0000 | ORAL_TABLET | ORAL | Status: DC | PRN
  Administered 2021-12-20: 1 via ORAL
  Administered 2021-12-21 (×2): 2 via ORAL
  Administered 2021-12-21 – 2021-12-22 (×3): 1 via ORAL
  Administered 2021-12-22 – 2021-12-23 (×3): 2 via ORAL
  Administered 2021-12-24 – 2021-12-25 (×5): 1 via ORAL
  Administered 2021-12-25: 2 via ORAL
  Administered 2021-12-25: 1 via ORAL
  Filled 2021-12-20 (×2): qty 2
  Filled 2021-12-20: qty 1
  Filled 2021-12-20: qty 2
  Filled 2021-12-20 (×2): qty 1
  Filled 2021-12-20 (×2): qty 2
  Filled 2021-12-20 (×4): qty 1
  Filled 2021-12-20 (×2): qty 2
  Filled 2021-12-20 (×2): qty 1

## 2021-12-20 MED ORDER — TRAMADOL HCL 50 MG PO TABS
50.0000 mg | ORAL_TABLET | Freq: Four times a day (QID) | ORAL | Status: DC | PRN
  Administered 2021-12-21: 50 mg via ORAL
  Filled 2021-12-20: qty 1

## 2021-12-20 MED ORDER — SODIUM CHLORIDE 0.9 % IV SOLN
2.0000 g | Freq: Once | INTRAVENOUS | Status: AC
  Administered 2021-12-20: 2 g via INTRAVENOUS
  Filled 2021-12-20: qty 12.5

## 2021-12-20 NOTE — ED Provider Notes (Signed)
[facility]-EMERGENCY DEPT Provider Note   CSN: [id number] Arrival date & time: 12/20/21  1419     History  Chief Complaint  Patient presents with   Leg Swelling   Leg Pain    Teresa Phillips is a 56 y.o. female.  HPI Started getting pain and swelling of the right lower leg about 16 days ago.  She reports at onset she felt generally unwell.  She had first had what she felt were flulike symptoms.  She had some general body aches felt fatigued and in that time her legs started swelling.  She reports she started getting blisters on the back of the right calf and then they got much bigger and turned into really large blister that broke open and drained.  She has been trying ibuprofen and Tylenol without relief.  Patient then went to urgent care and was evaluated.  At that time was thought to be cellulitis and she was started on doxycycline.  Patient reports she is taken 10 days of doxycycline and has not had any improvement.  She reports the leg is still swollen as it was previously and is painful.  Patient denies there was any trauma.  She has had no travel history.  She does not go outside and walk in lakes or river beds.  She has been trying to put topical Neosporin on it with no improvement.  Patient reports a couple months ago she had coughing and determined it was allergic.  She has been taking some allergy medications.    Home Medications Prior to Admission medications   Medication Sig Start Date End Date Taking? Authorizing Provider  albuterol (PROAIR HFA) 108 (90 Base) MCG/ACT inhaler Inhale 1-2 puffs into the lungs every 6 (six) hours as needed for wheezing or shortness of breath. 01/15/19   Georgetta HaberBurky, Natalie B, NP  amLODipine (NORVASC) 10 MG tablet Take 1 tablet (10 mg total) by mouth daily. 11/16/19   Darr, Gerilyn PilgrimJacob, PA-C  benzonatate (TESSALON) 100 MG capsule Take 1 capsule (100 mg total) by mouth every 8 (eight) hours. 04/03/19   Mardella LaymanHagler, Brian, MD   chlorpheniramine-HYDROcodone (TUSSIONEX PENNKINETIC ER) 10-8 MG/5ML LQCR Take 5 mLs by mouth at bedtime as needed. 07/01/11   Cyndra NumbersHunt, Meagan, MD  diphenhydrAMINE (BENADRYL) 50 MG capsule Take 50 mg by mouth every 6 (six) hours as needed.    [provider]  doxycycline (VIBRAMYCIN) 100 MG capsule Take 1 capsule (100 mg total) by mouth 2 (two) times daily for 10 days. 12/10/21 12/20/21  Valinda HoarWhite, Adrienne R, NP  guaiFENesin (MUCINEX) 600 MG 12 hr tablet Take by mouth 2 (two) times daily.    [provider]  predniSONE (STERAPRED UNI-PAK 21 TAB) 10 MG (21) TBPK tablet Take by mouth daily. Take as directed. 04/03/19   Mardella LaymanHagler, Brian, MD  Tetrahydroz-Polyvinyl Al-Povid (CLEAR EYES TRIPLE ACTION) 0.05-0.5-0.6 % SOLN Apply 2 drops to eye once.      [provider]  traMADol (ULTRAM) 50 MG tablet Take 1 tablet (50 mg total) by mouth every 6 (six) hours as needed. 12/15/21   Coderre, Elita BooneAdrienne R, NP  traMADol (ULTRAM) 50 MG tablet Take 1 tablet (50 mg total) by mouth every 6 (six) hours as needed. 12/15/21   Valinda HoarWhite, Adrienne R, NP  Zinc 15 MG CAPS Take 1 capsule by mouth 4 (four) times daily.      [provider]      Allergies    Avocado and Shellfish allergy    Review of  Systems   Review of Systems 10 systems reviewed negative except as per HPI Physical Exam Updated Vital Signs BP (!) 168/80 (BP Location: Left Arm)   Pulse (!) 118   Temp 98.9 F (37.2 C) (Oral)   Resp 18   SpO2 98%  Physical Exam Constitutional:      Comments: Alert nontoxic normal mental status.  No respiratory distress.  HENT:     Head: Normocephalic and atraumatic.     Mouth/Throat:     Pharynx: Oropharynx is clear.  Eyes:     Extraocular Movements: Extraocular movements intact.  Cardiovascular:     Rate and Rhythm: Normal rate and regular rhythm.  Pulmonary:     Effort: Pulmonary effort is normal.     Breath sounds: Normal breath sounds.  Abdominal:     General: There is no distension.      Palpations: Abdomen is soft.     Tenderness: There is no abdominal tenderness. There is no guarding.  Musculoskeletal:     Comments: Large 3+ edema of the right lower leg.  See attached images.  The webspaces of the toes do have Radke material consistent with chronic fungal infection.  Skin:    General: Skin is warm and dry.  Neurological:     General: No focal deficit present.     Mental Status: She is oriented to person, place, and time.     Motor: No weakness.     Coordination: Coordination normal.  Psychiatric:        Mood and Affect: Mood normal.         ED Results / Procedures / Treatments   Labs (all labs ordered are listed, but only abnormal results are displayed) Labs Reviewed  COMPREHENSIVE METABOLIC PANEL - Abnormal; Notable for the following components:      Result Value   Potassium 2.8 (*)    Chloride 114 (*)    CO2 20 (*)    Glucose, Bld 112 (*)    BUN 26 (*)    Creatinine, Ser 1.01 (*)    Albumin 2.7 (*)    AST 13 (*)    All other components within normal limits  CBC WITH DIFFERENTIAL/PLATELET - Abnormal; Notable for the following components:   WBC 12.0 (*)    RBC 2.65 (*)    Hemoglobin 7.8 (*)    HCT 24.6 (*)    Platelets 549 (*)    Neutro Abs 9.5 (*)    All other components within normal limits  CULTURE, BLOOD (ROUTINE X 2)  CULTURE, BLOOD (ROUTINE X 2)  CK  LACTIC ACID, PLASMA  LACTIC ACID, PLASMA  VITAMIN B12  FOLATE  IRON AND TIBC  FERRITIN  RETICULOCYTES  AMMONIA  BLOOD GAS, VENOUS  MAGNESIUM  PHOSPHORUS  PREALBUMIN  PROTIME-INR  SODIUM, URINE, RANDOM  URINALYSIS, COMPLETE (UACMP) WITH MICROSCOPIC  TSH  ETHANOL  RAPID URINE DRUG SCREEN, HOSP PERFORMED  OSMOLALITY, URINE  CREATININE, URINE, RANDOM  SEDIMENTATION RATE  C-REACTIVE PROTEIN  PROCALCITONIN  PROCALCITONIN  TYPE AND SCREEN    EKG None  Radiology VAS [place] LOWER EXTREMITY VENOUS (DVT) (7a-7p)  Result Date: 12/20/2021  Lower Venous DVT Study Patient Name:  Teresa Phillips  [facility]  Date of Exam:   12/20/2021 Medical Rec #: [id number]     Accession #:    [id number] Date of Birth: 1965/10/06     Patient Gender: F Patient Age:   40 years Exam Location:  [facility] Procedure:      VAS  [place] LOWER EXTREMITY VENOUS (DVT) Referring Phys: Velda Shell BLUE --------------------------------------------------------------------------------  Indications: Pain, Swelling, and Erythema. Other Indications: Patient diagnosed with cellulitis last week (compliant with                    antibiotics) with no relief in symptoms. Comparison Study: No previous exams Performing Technologist: Jody Hill RVT, RDMS  Examination Guidelines: A complete evaluation includes B-mode imaging, spectral Doppler, color Doppler, and power Doppler as needed of all accessible portions of each vessel. Bilateral testing is considered an integral part of a complete examination. Limited examinations for reoccurring indications may be performed as noted. The reflux portion of the exam is performed with the patient in reverse Trendelenburg.  +---------+---------------+---------+-----------+----------+-------------------+ RIGHT    CompressibilityPhasicitySpontaneityPropertiesThrombus Aging      +---------+---------------+---------+-----------+----------+-------------------+ CFV      Full           Yes      Yes                                      +---------+---------------+---------+-----------+----------+-------------------+ SFJ      Full                                                             +---------+---------------+---------+-----------+----------+-------------------+ FV Prox  Full           Yes      Yes                                      +---------+---------------+---------+-----------+----------+-------------------+ FV Mid   Full           Yes      Yes                                      +---------+---------------+---------+-----------+----------+-------------------+ FV DistalFull            Yes      Yes                                      +---------+---------------+---------+-----------+----------+-------------------+ PFV      Full                                                             +---------+---------------+---------+-----------+----------+-------------------+ POP      Full           Yes      Yes                                      +---------+---------------+---------+-----------+----------+-------------------+ PTV      Full                                                             +---------+---------------+---------+-----------+----------+-------------------+  PERO                                                  Not well visualized +---------+---------------+---------+-----------+----------+-------------------+   +----+---------------+---------+-----------+----------+--------------+ LEFTCompressibilityPhasicitySpontaneityPropertiesThrombus Aging +----+---------------+---------+-----------+----------+--------------+ CFV Full           Yes      Yes                                 +----+---------------+---------+-----------+----------+--------------+     Summary: RIGHT: - There is no evidence of deep vein thrombosis in the lower extremity.  - No cystic structure found in the popliteal fossa. - Subcutaneous edema in area of calf & popliteal fossa. - Ultrasound characteristics of enlarged lymph nodes are noted in the groin.  LEFT: - No evidence of common femoral vein obstruction.  *See table(s) above for measurements and observations.    Preliminary     Procedures Procedures    Medications Ordered in ED Medications  vancomycin (VANCOCIN) IVPB 1000 mg/200 mL premix (has no administration in time range)  ceFEPIme (MAXIPIME) 2 g in sodium chloride 0.9 % 100 mL IVPB (has no administration in time range)  potassium chloride SA (KLOR-CON M) CR tablet 40 mEq (has no administration in time range)    ED Course/ Medical Decision Making/  A&P                           Medical Decision Making Amount and/or Complexity of Data Reviewed Labs: ordered. Radiology: ordered.  Risk Prescription drug management. Decision regarding hospitalization.   Patient presents with leg swelling and redness with failed outpatient treatment for cellulitis.  Patient is nontoxic at this time.  She does have significant pain.  DVT study is obtained negative for clot.  At this time with significant persistent swelling and redness despite treatment, plan for admission for failed outpatient treatment of cellulitis.  Patient also has other lab derangements that are apparently new without prior medical history.  Patient had hypokalemia at 2.8.  She also has new anemia hemoglobin 7.8.  Due to outpatient failure and significant edema and erythema will initiate cefepime and vancomycin.  Consult: Dr. Adela Glimpse for admission.        Final Clinical Impression(s) / ED Diagnoses Final diagnoses:  Cellulitis of right lower extremity  Leg edema  Hypokalemia  Anemia, unspecified type  Thrombocytosis    Rx / DC Orders ED Discharge Orders     None         Arby Barrette, MD 12/20/21 2253

## 2021-12-20 NOTE — Assessment & Plan Note (Signed)
Has rehydrated currently creatinine improved after IV fluid administration continue fluids Obtain electrolytes

## 2021-12-20 NOTE — Progress Notes (Signed)
RLE venous duplex has been completed.  Results can be found under chart review under CV PROC. 12/20/2021 4:32 PM Elif Yonts RVT, RDMS

## 2021-12-20 NOTE — Assessment & Plan Note (Addendum)
-  admit per cellulitis protocol will           continue current antibiotic choice      plain films ordered       Will obtain MRSA screening,    obtain blood cultures if febrile or septic     further antibiotic adjustment pending above results Plain imaging showed no foreign bodies no air Given severe swelling will obtain CT to eval for abscess

## 2021-12-20 NOTE — Assessment & Plan Note (Signed)
-   will replace and repeat in AM,  check magnesium level and replace as needed ° °

## 2021-12-20 NOTE — Subjective & Objective (Signed)
Giant blister of left leg, 2 wks ago right leg swell red with blisters Initially had flu like symptoms Urgenc care gave her 10 days of Doxy DVT neg  Hx of HTN on NOrvasc

## 2021-12-20 NOTE — H&P (Signed)
Teresa Phillips [id number] DOB: Nov 14, 1965 DOA: 12/20/2021   PCP: Patient, No Pcp Per (Inactive)   Outpatient Specialists:  NONE    Patient arrived to ER on 12/20/21 at 1419 Referred by Attending Charlesetta Shanks, MD   Patient coming from:    home Lives With roomates     Chief Complaint:   Chief Complaint  Patient presents with   Leg Swelling   Leg Pain    HPI: Teresa Phillips is a 56 y.o. female with medical history significant of HTN     Presented with   right leg swelling and pain  Giant blister of left leg, 2 wks ago right leg swell red with blisters Initially had flu like symptoms Urgenc care gave her 10 days of Doxy DVT neg  Hx of HTN on NOrvasc No sick contacts Symptoms started with flu like symptoms and followed by right leg swelling and blister By that time her flu like symptoms got better on the 3 rd day The blister on her leg started on the 4th day The blister popped spontaneously producing clear fluid Denies any spider bites  No pets She smokes does not drink on the regular basis    Reports no hx of anemia No menstrual periods  Reports day before yesterday she had some blood in her stool Yesterday it was a bit better She was having a lot of BM so she took immodium Reports she has been taking a lot of ibuprofen for pain and a  lot of Excedrin now she is having abdominal pain epigastric pain  She tried to eat rice and vomit it no blood in vomit  She has been taking ibuprofen 5-6 every 4 h in addition she was taking 2 Excedrin with that  Have had decreased PO intake She reports feeling lightheaded  No CP Never had a colonoscopy She has been putting on neosporin, shea butter, no improvement She did use heating pad but only after she had a blister   Regarding pertinent Chronic problems:    HTN on not on meds now    obesity-   BMI Readings from Last 1 Encounters:  12/10/21 36.80 kg/m         While in ER:   Started on cefepime and  vancomycin For cellulitis Dopplers today negative   Ordered Right tib/fib 1. No acute fracture or dislocation. 2. Diffuse subcutaneous edema and soft tissue swelling of the dorsum of the foot     CXR -  NON acute    Following Medications were ordered in ER: Medications  vancomycin (VANCOCIN) IVPB 1000 mg/200 mL premix (has no administration in time range)  ceFEPIme (MAXIPIME) 2 g in sodium chloride 0.9 % 100 mL IVPB (has no administration in time range)  potassium chloride SA (KLOR-CON M) CR tablet 40 mEq (has no administration in time range)       ED Triage Vitals  Enc Vitals Group     BP 12/20/21 1439 (!) 168/80     Pulse Rate 12/20/21 1439 (!) 118     Resp 12/20/21 1439 18     Temp 12/20/21 1440 98.9 F (37.2 C)     Temp Source 12/20/21 1440 Oral     SpO2 12/20/21 1439 98 %     Weight --      Height --      Head Circumference --      Peak Flow --      Pain Score 12/20/21 1441 7  Pain Loc --      Pain Edu? --      Excl. in [place]? --   TMAX(24)@     _________________________________________ Significant initial  Findings: Abnormal Labs Reviewed  COMPREHENSIVE METABOLIC PANEL - Abnormal; Notable for the following components:      Result Value   Potassium 2.8 (*)    Chloride 114 (*)    CO2 20 (*)    Glucose, Bld 112 (*)    BUN 26 (*)    Creatinine, Ser 1.01 (*)    Albumin 2.7 (*)    AST 13 (*)    All other components within normal limits  CBC WITH DIFFERENTIAL/PLATELET - Abnormal; Notable for the following components:   WBC 12.0 (*)    RBC 2.65 (*)    Hemoglobin 7.8 (*)    HCT 24.6 (*)    Platelets 549 (*)    Neutro Abs 9.5 (*)    All other components within normal limits     ECG: Ordered     The recent clinical data is shown below. Vitals:   12/20/21 1439 12/20/21 1440  BP: (!) 168/80   Pulse: (!) 118   Resp: 18   Temp:  98.9 F (37.2 C)  TempSrc:  Oral  SpO2: 98%     WBC     Component Value Date/Time   WBC 12.0 (H) 12/20/2021 1517    LYMPHSABS 1.5 12/20/2021 1517   MONOABS 0.8 12/20/2021 1517   EOSABS 0.1 12/20/2021 1517   BASOSABS 0.1 12/20/2021 1517      Lactic Acid, Venous    Component Value Date/Time   LATICACIDVEN 0.9 12/20/2021 2027       UA  ordered      _______________________________________________ Hospitalist was called for admission for   Cellulitis of right lower extremity Hypokalemia, Anemia, unspecified type   The following Work up has been ordered so far:  Orders Placed This Encounter  Procedures   Culture, blood (routine x 2)   Comprehensive metabolic panel   CBC with Differential   CK   Lactic acid, plasma   Vitamin B12   Folate   Iron and TIBC   Ferritin   Reticulocytes   Diet regular Room service appropriate? Yes; Fluid consistency: Thin   Consult to hospitalist   Type and screen [facility]   VAS [place] LOWER EXTREMITY VENOUS (DVT) (7a-7p)     OTHER Significant initial  Findings:  labs showing:    Recent Labs  Lab 12/20/21 1517  NA 142  K 2.8*  CO2 20*  GLUCOSE 112*  BUN 26*  CREATININE 1.01*  CALCIUM 9.1    Cr     Up from baseline see below Lab Results  Component Value Date   CREATININE 1.01 (H) 12/20/2021   CREATININE 0.70 11/16/2019   CREATININE 0.73 09/07/2015    Recent Labs  Lab 12/20/21 1517  AST 13*  ALT 10  ALKPHOS 71  BILITOT 0.6  PROT 7.4  ALBUMIN 2.7*   Lab Results  Component Value Date   CALCIUM 9.1 12/20/2021    Plt: Lab Results  Component Value Date   PLT 549 (H) 12/20/2021      Venous  Blood Gas result:    7.35 Acid-base deficit 2.9 High  mmol/L  pCO2, Ven 41 Low  mmHg           Recent Labs  Lab 12/20/21 1517 12/20/21 2045  WBC 12.0* 9.5  NEUTROABS 9.5*  --   HGB  7.8* 7.1*  HCT 24.6* 22.0*  MCV 92.8 92.1  PLT 549* 522*    HG/HCT  Down   from baseline see below    Component Value Date/Time   HGB 7.8 (L) 12/20/2021 1517   HCT 24.6 (L) 12/20/2021 1517   MCV 92.8 12/20/2021 1517        Cardiac Panel (last 3 results) Recent Labs    12/20/21 1517  CKTOTAL 64        Cultures: No results found for: "SDES", "SPECREQUEST", "CULT", "REPTSTATUS"   Radiological Exams on Admission: VAS [place] LOWER EXTREMITY VENOUS (DVT) (7a-7p)  Result Date: 12/20/2021  Lower Venous DVT Study Patient Name:  Teresa Phillips  Date of Exam:   12/20/2021 Medical Rec #: [id number]     Accession #:    [id number] Date of Birth: 05-13-1966     Patient Gender: F Patient Age:   56 years Exam Location:  [facility] Procedure:      VAS [place] LOWER EXTREMITY VENOUS (DVT) Referring Phys: [place] --------------------------------------------------------------------------------  Indications: Pain, Swelling, and Erythema. Other Indications: Patient diagnosed with cellulitis last week (compliant with                    antibiotics) with no relief in symptoms. Comparison Study: No previous exams Performing Technologist: Jody Hill RVT, RDMS  Examination Guidelines: A complete evaluation includes B-mode imaging, spectral Doppler, color Doppler, and power Doppler as needed of all accessible portions of each vessel. Bilateral testing is considered an integral part of a complete examination. Limited examinations for reoccurring indications may be performed as noted. The reflux portion of the exam is performed with the patient in reverse Trendelenburg.  +---------+---------------+---------+-----------+----------+-------------------+ RIGHT    CompressibilityPhasicitySpontaneityPropertiesThrombus Aging      +---------+---------------+---------+-----------+----------+-------------------+ CFV      Full           Yes      Yes                                      +---------+---------------+---------+-----------+----------+-------------------+ SFJ      Full                                                             +---------+---------------+---------+-----------+----------+-------------------+ FV Prox  Full            Yes      Yes                                      +---------+---------------+---------+-----------+----------+-------------------+ FV Mid   Full           Yes      Yes                                      +---------+---------------+---------+-----------+----------+-------------------+ FV DistalFull           Yes      Yes                                      +---------+---------------+---------+-----------+----------+-------------------+  PFV      Full                                                             +---------+---------------+---------+-----------+----------+-------------------+ POP      Full           Yes      Yes                                      +---------+---------------+---------+-----------+----------+-------------------+ PTV      Full                                                             +---------+---------------+---------+-----------+----------+-------------------+ PERO                                                  Not well visualized +---------+---------------+---------+-----------+----------+-------------------+   +----+---------------+---------+-----------+----------+--------------+ LEFTCompressibilityPhasicitySpontaneityPropertiesThrombus Aging +----+---------------+---------+-----------+----------+--------------+ CFV Full           Yes      Yes                                 +----+---------------+---------+-----------+----------+--------------+     Summary: RIGHT: - There is no evidence of deep vein thrombosis in the lower extremity.  - No cystic structure found in the popliteal fossa. - Subcutaneous edema in area of calf & popliteal fossa. - Ultrasound characteristics of enlarged lymph nodes are noted in the groin.  LEFT: - No evidence of common femoral vein obstruction.  *See table(s) above for measurements and observations.    Preliminary     _______________________________________________________________________________________________________ Latest   Blood pressure (!) 168/80, pulse (!) 118, temperature 98.9 F (37.2 C), temperature source Oral, resp. rate 18, SpO2 98 %.   Vitals  labs and radiology finding personally reviewed  Review of Systems:    Pertinent positives include:   Fevers, chills, fatigue,  lower extremity swelling  Constitutional:  No weight loss, night sweats,weight loss  HEENT:  No headaches, Difficulty swallowing,Tooth/dental problems,Sore throat,  No sneezing, itching, ear ache, nasal congestion, post nasal drip,  Cardio-vascular:  No chest pain, Orthopnea, PND, anasarca, dizziness, palpitations.no Bilateral  GI:  No heartburn, indigestion, abdominal pain, nausea, vomiting, diarrhea, change in bowel habits, loss of appetite, melena, blood in stool, hematemesis Resp:  no shortness of breath at rest. No dyspnea on exertion, No excess mucus, no productive cough, No non-productive cough, No coughing up of blood.No change in color of mucus.No wheezing. Skin:  no rash or lesions. No jaundice GU:  no dysuria, change in color of urine, no urgency or frequency. No straining to urinate.  No flank pain.  Musculoskeletal:  No joint pain or no joint swelling. No decreased range of motion. No back pain.  Psych:  No change in mood or affect. No depression or anxiety. No memory loss.  Neuro: no localizing neurological complaints, no tingling, no weakness, no double vision, no gait abnormality, no slurred speech, no confusion  All systems reviewed and apart from Alligator all are negative _______________________________________________________________________________________________ Past Medical History:  History reviewed. No pertinent past medical history.    History reviewed. No pertinent surgical history.  Social History:  Ambulatory   independently      reports that she has been smoking cigarettes. She  has never used smokeless tobacco. She reports that she does not drink alcohol and does not use drugs.     Family History:   Family History  Problem Relation Age of Onset   Hypertension Mother    ______________________________________________________________________________________________ Allergies: Allergies  Allergen Reactions   Avocado Other (See Comments)    welps.   Shellfish Allergy Other (See Comments)    welps.     Prior to Admission medications   Medication Sig Start Date End Date Taking? Authorizing Provider  albuterol (PROAIR HFA) 108 (90 Base) MCG/ACT inhaler Inhale 1-2 puffs into the lungs every 6 (six) hours as needed for wheezing or shortness of breath. 01/15/19   Zigmund Gottron, NP  amLODipine (NORVASC) 10 MG tablet Take 1 tablet (10 mg total) by mouth daily. 11/16/19   Darr, Edison Nasuti, PA-C  benzonatate (TESSALON) 100 MG capsule Take 1 capsule (100 mg total) by mouth every 8 (eight) hours. 04/03/19   Vanessa Kick, MD  chlorpheniramine-HYDROcodone (TUSSIONEX PENNKINETIC ER) 10-8 MG/5ML LQCR Take 5 mLs by mouth at bedtime as needed. 07/01/11   Chauncy Passy, MD  diphenhydrAMINE (BENADRYL) 50 MG capsule Take 50 mg by mouth every 6 (six) hours as needed.    [provider]  doxycycline (VIBRAMYCIN) 100 MG capsule Take 1 capsule (100 mg total) by mouth 2 (two) times daily for 10 days. 12/10/21 12/20/21  Hans Eden, NP  guaiFENesin (MUCINEX) 600 MG 12 hr tablet Take by mouth 2 (two) times daily.    [provider]  predniSONE (STERAPRED UNI-PAK 21 TAB) 10 MG (21) TBPK tablet Take by mouth daily. Take as directed. 04/03/19   Vanessa Kick, MD  Tetrahydroz-Polyvinyl Al-Povid (CLEAR EYES TRIPLE ACTION) 0.05-0.5-0.6 % SOLN Apply 2 drops to eye once.      [provider]  traMADol (ULTRAM) 50 MG tablet Take 1 tablet (50 mg total) by mouth every 6 (six) hours as needed. 12/15/21   Toman, Leitha Schuller, NP  traMADol (ULTRAM) 50 MG tablet Take 1 tablet (50 mg  total) by mouth every 6 (six) hours as needed. 12/15/21   Hans Eden, NP  Zinc 15 MG CAPS Take 1 capsule by mouth 4 (four) times daily.      [provider]    ___________________________________________________________________________________________________ Physical Exam:    12/20/2021    2:39 PM 12/10/2021    7:46 PM 12/10/2021    7:45 PM  Vitals with BMI  Height   5\' 8"   Weight   242 lbs  BMI   [id number]  Systolic [id number]   Diastolic 80 86   Pulse [id number] 96      1. General:  in No  Acute distress   well   -appearing 2. Psychological: Alert and   Oriented 3. Head/ENT:   Dry Mucous Membranes                          Head Non traumatic, neck supple  Poor Dentition 4. SKIN: decreased Skin turgor,  Skin clean Dry and intact right lower extremity swelling and warmth noted     5. Heart: Regular rate and rhythm no  Murmur, no Rub or gallop 6. Lungs:  no wheezes or crackles   7. Abdomen: Soft, epigastric-tender, Non distended   obese  bowel sounds present 8. Lower extremities: no clubbing, cyanosis,  R > L edema 9. Neurologically Grossly intact, moving all 4 extremities equally   10. MSK: Normal range of motion    Chart has been reviewed  ______________________________________________________________________________________________  Assessment/Plan 56 y.o. female with medical history significant of HTN   Admitted for   Cellulitis of right lower extremity,  Leg edema, Hypokalemia, symptomatic anemia, AKI      Present on Admission:  Cellulitis of right leg  Normocytic anemia  Hypokalemia  Essential hypertension  Tobacco abuse  Upper GI bleed  AKI (acute kidney injury) ([place])     Cellulitis of right leg -admit per  cellulitis protocol will           continue current antibiotic choice      plain films ordered       Will obtain MRSA screening,    obtain blood cultures  if febrile or septic     further antibiotic adjustment pending above  results Plain imaging showed no foreign bodies no air Given severe swelling will obtain CT to eval for abscess   Hypokalemia - will replace and repeat in AM,  check magnesium level and replace as needed   Normocytic anemia Obtain anemia panle, hemoccult stool transfuse 1 unit given dropping HG Suspect secondary to GI bleed in the setting of NSAID abuse   Essential hypertension Allow permissive HTn   Tobacco abuse  - Spoke about importance of quitting spent 5 minutes discussing options for treatment, prior attempts at quitting, and dangers of smoking  -At this point patient is  interested in quitting  - order nicotine patch   - nursing tobacco cessation protocol   Upper GI bleed  - Glasgow Blatchford score BUN >18. Hg <13  <64F      Modifying risk factors include:    NSAIDS use     Sent msg to  gastroenterology ( [facility], ) appreciate their consult   - serial CBC.    - Monitor for any recurrence,  evidence of hemodynamic instability or significant blood loss  - Transfuse as needed for hemoglobin below 7 or evidence of life-threatening bleeding  - Establish at least 2 PIV and fluid resuscitate   - clear liquids for tonight keep nothing by mouth post midnight,   -  administer Protonix   twice a day      Lower gi bleed also a possiblity     AKI (acute kidney injury) ([place]) Has rehydrated currently creatinine improved after IV fluid administration continue fluids Obtain electrolytes    Other plan as per orders.  DVT prophylaxis:  SCD      Code Status:    Code Status: Not on file FULL CODE   as per patient   I had personally discussed CODE STATUS with patient     Family Communication:   Family not at  Bedside    Disposition Plan:     To home once workup is complete and patient is stable   Following barriers for discharge:  Electrolytes corrected                               Anemia corrected                             Pain controlled  with PO medications                                                           Will need consultants to evaluate patient prior to discharge                       Consults called: sent msg to [facility]  Admission status:  ED Disposition     ED Disposition  Admit   Condition  --   [place]: [facility] [[id number]]  Level of Care: Telemetry [5]  Admit to tele based on following criteria: Other see comments  Comments: hypokalemia  May admit patient to [facility] or [facility] if equivalent level of care is available:: No  Covid Evaluation: Asymptomatic - no recent exposure (last 10 days) testing not required  Diagnosis: Cellulitis of right leg [id number]  Admitting Physician: Toy Baker [[id number]]  Attending Physician: Toy Baker [[id number]]  Estimated length of stay: past midnight tomorrow  Certification:: I certify this patient will need inpatient services for at least 2 midnights             inpatient     I Expect 2 midnight stay secondary to severity of patient's current illness need for inpatient interventions justified by the following:     Severe lab/radiological/exam abnormalities including:    anemia    That are currently affecting medical management.   I expect  patient to be hospitalized for 2 midnights requiring inpatient medical care.  Patient is at high risk for adverse outcome (such as loss of life or disability) if not treated.  Indication for inpatient stay as follows:    severe pain requiring acute inpatient management,  inability to maintain oral hydration    Need for operative/procedural  intervention      Need for IV antibiotics, IV fluids, IV  PPI, IV narcotics    Level of care     tele  For  24H       Avedis Bevis 12/20/2021, 10:06 PM    [facility]     after 2 AM please page floor coverage PA If 7AM-7PM, please contact the day team taking care of the patient using [email or web address]    Patient was evaluated in the context of the global COVID-19 pandemic, which necessitated consideration that the patient might be at risk for infection with the SARS-CoV-2 virus that causes COVID-19. Institutional protocols and algorithms that pertain to the evaluation of patients at risk for COVID-19 are in a state of rapid change based on information released by regulatory bodies including the CDC and federal and state organizations. These policies and algorithms were followed during the patient's care.

## 2021-12-20 NOTE — Assessment & Plan Note (Signed)
Allow permissive HTn  °

## 2021-12-20 NOTE — ED Triage Notes (Signed)
Pt reports swelling and pain in right leg. Pt reports she was given antibiotic at [facility] but states they have not been helping. Pt reports she was told she has an infection on her leg.

## 2021-12-20 NOTE — Progress Notes (Signed)
Pharmacy Antibiotic Note  Teresa Phillips is a 56 y.o. female admitted on 12/20/2021 with sepsis and cellulitis.  Pharmacy has been consulted for Vancomycin, Cefepime dosing.  Plan: Cefepime 2g IV q8h  Vancomycin 2g IV x1 followed by 1500mg  IV q24h.  (SCr 1.01, Vd 0.5, Est AUC 494) Measure Vanc levels as needed; Goal AUC = 400 - 550. Follow up renal function, culture results, and clinical course.      Temp (24hrs), Avg:98.9 F (37.2 C), Min:98.9 F (37.2 C), Max:98.9 F (37.2 C)  Recent Labs  Lab 12/20/21 1517  WBC 12.0*  CREATININE 1.01*    Estimated Creatinine Clearance: 78.9 mL/min (A) (by C-G formula based on SCr of 1.01 mg/dL (H)).    Allergies  Allergen Reactions   Avocado Other (See Comments)    welps.   Shellfish Allergy Other (See Comments)    welps.      Thank you for allowing pharmacy to be a part of this patient's care.  02/19/22 PharmD, BCPS Clinical Pharmacist [facility] main pharmacy [phone] 12/20/2021 9:19 PM

## 2021-12-20 NOTE — Assessment & Plan Note (Addendum)
Obtain anemia panle, hemoccult stool transfuse 1 unit given dropping HG Suspect secondary to GI bleed in the setting of NSAID abuse

## 2021-12-20 NOTE — ED Notes (Signed)
ED TO INPATIENT HANDOFF REPORT  Name/Age/Gender Teresa Phillips 56 y.o. female  Code Status   Home/SNF/Other Home  Chief Complaint Cellulitis of right leg [L03.115]  Level of Care/Admitting Diagnosis ED Disposition     ED Disposition  Admit   Condition  --   Comment  Hospital Area: [facility] [[id number]]  Level of Care: Telemetry [5]  Admit to tele based on following criteria: Other see comments  Comments: hypokalemia  May admit patient to [facility] or [facility] if equivalent level of care is available:: No  Covid Evaluation: Asymptomatic - no recent exposure (last 10 days) testing not required  Diagnosis: Cellulitis of right leg [161096][[id number]]  Admitting Physician: Therisa DoyneUTOVA, ANASTASSIA [[id number]]  Attending Physician: Therisa DoyneUTOVA, ANASTASSIA [[id number]]  Estimated length of stay: past midnight tomorrow  Certification:: I certify this patient will need inpatient services for at least 2 midnights          Medical History History reviewed. No pertinent past medical history.  Allergies Allergies  Allergen Reactions   Avocado Other (See Comments)    welps.   Shellfish Allergy Other (See Comments)    welps.    IV Location/Drains/Wounds Patient Lines/Drains/Airways Status     Active Line/Drains/Airways     Name Placement date Placement time Site Days   Peripheral IV 12/20/21 20 G 1" Left Antecubital 12/20/21  2048  Antecubital  less than 1            Labs/Imaging Results for orders placed or performed during the hospital encounter of 12/20/21 (from the past 48 hour(s))  Comprehensive metabolic panel     Status: Abnormal   Collection Time: 12/20/21  3:17 PM  Result Value Ref Range   Sodium 142 135 - 145 mmol/L   Potassium 2.8 (L) 3.5 - 5.1 mmol/L   Chloride 114 (H) 98 - 111 mmol/L   CO2 20 (L) 22 - 32 mmol/L   Glucose, Bld 112 (H) 70 - 99 mg/dL    Comment: Glucose reference range applies only to samples taken after fasting for at least 8 hours.    BUN 26 (H) 6 - 20 mg/dL   Creatinine, Ser 0.451.01 (H) 0.44 - 1.00 mg/dL   Calcium 9.1 8.9 - 40.910.3 mg/dL   Total Protein 7.4 6.5 - 8.1 g/dL   Albumin 2.7 (L) 3.5 - 5.0 g/dL   AST 13 (L) 15 - 41 U/L   ALT 10 0 - 44 U/L   Alkaline Phosphatase 71 38 - 126 U/L   Total Bilirubin 0.6 0.3 - 1.2 mg/dL   GFR, Estimated >81>60 >19>60 mL/min    Comment: (NOTE) Calculated using the CKD-EPI Creatinine Equation (2021)    Anion gap 8 5 - 15    Comment: Performed at [facility], [address] [place], [place] [id number]  CBC with Differential     Status: Abnormal   Collection Time: 12/20/21  3:17 PM  Result Value Ref Range   WBC 12.0 (H) 4.0 - 10.5 K/uL   RBC 2.65 (L) 3.87 - 5.11 MIL/uL   Hemoglobin 7.8 (L) 12.0 - 15.0 g/dL   HCT 95.624.6 (L) 21.336.0 - 08.646.0 %   MCV 92.8 80.0 - 100.0 fL   MCH 29.4 26.0 - 34.0 pg   MCHC 31.7 30.0 - 36.0 g/dL   RDW 57.814.7 46.911.5 - 62.915.5 %   Platelets 549 (H) 150 - 400 K/uL   nRBC 0.0 0.0 - 0.2 %   Neutrophils Relative % 79 %  Neutro Abs 9.5 (H) 1.7 - 7.7 K/uL   Lymphocytes Relative 12 %   Lymphs Abs 1.5 0.7 - 4.0 K/uL   Monocytes Relative 7 %   Monocytes Absolute 0.8 0.1 - 1.0 K/uL   Eosinophils Relative 1 %   Eosinophils Absolute 0.1 0.0 - 0.5 K/uL   Basophils Relative 1 %   Basophils Absolute 0.1 0.0 - 0.1 K/uL   Immature Granulocytes 0 %   Abs Immature Granulocytes 0.04 0.00 - 0.07 K/uL    Comment: Performed at [facility], [address], [place], [place] [address]  [place]     Status: None   Collection Time: 12/20/21  3:17 PM  Result Value Ref Range   Total CK 64 38 - 234 U/L    Comment: Performed at [facility], [address] [place], [place] [address]   VAS [place] LOWER EXTREMITY VENOUS (DVT) (7a-7p)  Result Date: 12/20/2021  Lower Venous DVT Study Patient Name:  Teresa Phillips  Date of Exam:   12/20/2021 Medical Rec #: [id number]     Accession #:    [id number] Date of Birth: 12/02/1965     Patient Gender: F Patient  Age:   56 years Exam Location:  [facility] Procedure:      VAS [place] LOWER EXTREMITY VENOUS (DVT) Referring Phys: Velda Shell BLUE --------------------------------------------------------------------------------  Indications: Pain, Swelling, and Erythema. Other Indications: Patient diagnosed with cellulitis last week (compliant with                    antibiotics) with no relief in symptoms. Comparison Study: No previous exams Performing Technologist: Jody Hill RVT, RDMS  Examination Guidelines: A complete evaluation includes B-mode imaging, spectral Doppler, color Doppler, and power Doppler as needed of all accessible portions of each vessel. Bilateral testing is considered an integral part of a complete examination. Limited examinations for reoccurring indications may be performed as noted. The reflux portion of the exam is performed with the patient in reverse Trendelenburg.  +---------+---------------+---------+-----------+----------+-------------------+ RIGHT    CompressibilityPhasicitySpontaneityPropertiesThrombus Aging      +---------+---------------+---------+-----------+----------+-------------------+ CFV      Full           Yes      Yes                                      +---------+---------------+---------+-----------+----------+-------------------+ SFJ      Full                                                             +---------+---------------+---------+-----------+----------+-------------------+ FV Prox  Full           Yes      Yes                                      +---------+---------------+---------+-----------+----------+-------------------+ FV Mid   Full           Yes      Yes                                      +---------+---------------+---------+-----------+----------+-------------------+  FV DistalFull           Yes      Yes                                      +---------+---------------+---------+-----------+----------+-------------------+  PFV      Full                                                             +---------+---------------+---------+-----------+----------+-------------------+ POP      Full           Yes      Yes                                      +---------+---------------+---------+-----------+----------+-------------------+ PTV      Full                                                             +---------+---------------+---------+-----------+----------+-------------------+ PERO                                                  Not well visualized +---------+---------------+---------+-----------+----------+-------------------+   +----+---------------+---------+-----------+----------+--------------+ LEFTCompressibilityPhasicitySpontaneityPropertiesThrombus Aging +----+---------------+---------+-----------+----------+--------------+ CFV Full           Yes      Yes                                 +----+---------------+---------+-----------+----------+--------------+     Summary: RIGHT: - There is no evidence of deep vein thrombosis in the lower extremity.  - No cystic structure found in the popliteal fossa. - Subcutaneous edema in area of calf & popliteal fossa. - Ultrasound characteristics of enlarged lymph nodes are noted in the groin.  LEFT: - No evidence of common femoral vein obstruction.  *See table(s) above for measurements and observations.    Preliminary     Pending Labs Unresulted Labs (From admission, onward)     Start     Ordered   12/21/21 0500  Prealbumin  Tomorrow morning,   R        12/20/21 2022   12/21/21 0500  Procalcitonin  Daily,   R      12/20/21 2030   12/20/21 2200  CBC  Once,   R        12/20/21 2100   12/20/21 2200  Basic metabolic panel  Once,   R        12/20/21 2100   12/20/21 2058  MRSA Next Gen by PCR, Nasal  Once,   R        12/20/21 2057   12/20/21 2031  Procalcitonin - Baseline  Add-on,   AD        12/20/21 2030   12/20/21 2028   Sedimentation rate  Add-on,  AD        12/20/21 2027   12/20/21 2028  C-reactive protein  Add-on,   AD        12/20/21 2027   12/20/21 2027  Ethanol  Add-on,   AD        12/20/21 2026   12/20/21 2027  Rapid urine drug screen (hospital performed)  ONCE - STAT,   STAT        12/20/21 2026   12/20/21 2027  Osmolality, urine  Once,   R        12/20/21 2026   12/20/21 2027  Creatinine, urine, random  Once,   R        12/20/21 2026   12/20/21 2023  Ammonia  Once,   STAT       Question:  Release to patient  Answer:  Immediate   12/20/21 2022   12/20/21 2023  Blood gas, venous  ONCE - STAT,   R       Question:  Release to patient  Answer:  Immediate   12/20/21 2022   12/20/21 2023  Magnesium  Add-on,   AD        12/20/21 2022   12/20/21 2023  Phosphorus  Add-on,   AD        12/20/21 2022   12/20/21 2023  Protime-INR  Once,   STAT       Question:  Release to patient  Answer:  Immediate   12/20/21 2022   12/20/21 2023  Sodium, urine, random  Once,   URGENT        12/20/21 2022   12/20/21 2023  Urinalysis, Complete w Microscopic Urine, Clean Catch  Once,   URGENT       Question:  Release to patient  Answer:  Immediate   12/20/21 2022   12/20/21 2023  TSH  Add-on,   AD        12/20/21 2022   12/20/21 2012  Type and screen [facility]  Once,   STAT       Comments: [facility]    12/20/21 2012   12/20/21 2011  Vitamin B12  (Anemia Panel (PNL))  Once,   URGENT        12/20/21 2012   12/20/21 2011  Folate  (Anemia Panel (PNL))  Once,   URGENT        12/20/21 2012   12/20/21 2011  Iron and TIBC  (Anemia Panel (PNL))  Once,   URGENT        12/20/21 2012   12/20/21 2011  Ferritin  (Anemia Panel (PNL))  Once,   URGENT        12/20/21 2012   12/20/21 2011  Reticulocytes  (Anemia Panel (PNL))  Once,   URGENT        12/20/21 2012   12/20/21 2005  Lactic acid, plasma  Now then every 2 hours,   R (with STAT occurrences)      12/20/21 2012   12/20/21 2005   Culture, blood (routine x 2)  BLOOD CULTURE X 2,   R (with STAT occurrences)      12/20/21 2012   Unscheduled  Occult blood card to lab, stool RN will collect  As needed,   R     Question:  Specimen to be collected by:  Answer:  RN will collect   12/20/21 2024            Vitals/Pain Today's Vitals  12/20/21 1439 12/20/21 1440 12/20/21 1441  BP: (!) 168/80    Pulse: (!) 118    Resp: 18    Temp:  98.9 F (37.2 C)   TempSrc:  Oral   SpO2: 98%    PainSc:   7     Isolation Precautions No active isolations  Medications Medications  vancomycin (VANCOCIN) IVPB 1000 mg/200 mL premix (has no administration in time range)  ceFEPIme (MAXIPIME) 2 g in sodium chloride 0.9 % 100 mL IVPB (2 g Intravenous New Bag/Given 12/20/21 2052)  potassium chloride SA (KLOR-CON M) CR tablet 40 mEq (40 mEq Oral Given 12/20/21 2053)    Mobility walks

## 2021-12-20 NOTE — ED Provider Triage Note (Signed)
Emergency Medicine Provider Triage Evaluation Note  Teresa Phillips , a 56 y.o. female  was evaluated in triage.  Pt complains of right leg pain/swelling onset 16 days. Was seen at urgent care and given doxy for possible cellulitis. Has been compliant with the medication. Denies chest pain, shortness of breath, vomiting. Denies PMHx of DM.  Denies past medical history of DVT/PE, anticoagulant use, history of malignancy, recent immobilization/surgery, OCP, HRT.  Review of Systems  Positive: As per HPI above Negative:   Physical Exam  BP (!) 168/80 (BP Location: Left Arm)   Pulse (!) 118   Temp 98.9 F (37.2 C) (Oral)   Resp 18   SpO2 98%  Gen:   Awake, no distress   Resp:  Normal effort  MSK:   Moves extremities without difficulty  Other:  Moderate swelling to right leg with pitting edema noted. Pulses intact. Skin peeling.   Medical Decision Making  Medically screening exam initiated at 3:13 PM.  Appropriate orders placed.  ANORA SCHWENKE was informed that the remainder of the evaluation will be completed by another provider, this initial triage assessment does not replace that evaluation, and the importance of remaining in the ED until their evaluation is complete.  Work-up initiated.    Kasarah Sitts A, PA-C 12/20/21 1524

## 2021-12-20 NOTE — Assessment & Plan Note (Signed)
-   Spoke about importance of quitting spent 5 minutes discussing options for treatment, prior attempts at quitting, and dangers of smoking ? -At this point patient is    interested in quitting ? - order nicotine patch  ? - nursing tobacco cessation protocol ? ?

## 2021-12-20 NOTE — Assessment & Plan Note (Signed)
-   Gl[place] Blatchford score BUN >18. Hg <13  <38F      Modifying risk factors include:    NSAIDS use     Sent msg to  gastroenterology ( [place], ) appreciate their consult   - serial CBC.    - Monitor for any recurrence,  evidence of hemodynamic instability or significant blood loss  - Transfuse as needed for hemoglobin below 7 or evidence of life-threatening bleeding  - Establish at least 2 PIV and fluid resuscitate   - clear liquids for tonight keep nothing by mouth post midnight,   -  administer Protonix   twice a day      Lower gi bleed also a possiblity

## 2021-12-21 ENCOUNTER — Inpatient Hospital Stay (HOSPITAL_COMMUNITY): Payer: BC Managed Care – PPO

## 2021-12-21 DIAGNOSIS — E876 Hypokalemia: Secondary | ICD-10-CM | POA: Diagnosis not present

## 2021-12-21 DIAGNOSIS — D649 Anemia, unspecified: Secondary | ICD-10-CM | POA: Diagnosis not present

## 2021-12-21 DIAGNOSIS — L03115 Cellulitis of right lower limb: Secondary | ICD-10-CM | POA: Diagnosis not present

## 2021-12-21 DIAGNOSIS — I10 Essential (primary) hypertension: Secondary | ICD-10-CM | POA: Diagnosis not present

## 2021-12-21 LAB — IRON AND TIBC
Iron: 36 ug/dL (ref 28–170)
Saturation Ratios: 20 % (ref 10.4–31.8)
TIBC: 176 ug/dL — ABNORMAL LOW (ref 250–450)
UIBC: 140 ug/dL

## 2021-12-21 LAB — CBC
HCT: 19.7 % — ABNORMAL LOW (ref 36.0–46.0)
HCT: 20.9 % — ABNORMAL LOW (ref 36.0–46.0)
HCT: 22.9 % — ABNORMAL LOW (ref 36.0–46.0)
HCT: 20.2 % — ABNORMAL LOW (ref 36.0–46.0)
Hemoglobin: 6.2 g/dL — CL (ref 12.0–15.0)
Hemoglobin: 6.6 g/dL — CL (ref 12.0–15.0)
Hemoglobin: 6.7 g/dL — CL (ref 12.0–15.0)
Hemoglobin: 6.6 g/dL — CL (ref 12.0–15.0)
MCH: 30 pg (ref 26.0–34.0)
MCH: 29.7 pg (ref 26.0–34.0)
MCH: 30.3 pg (ref 26.0–34.0)
MCH: 30.3 pg (ref 26.0–34.0)
MCHC: 31.5 g/dL (ref 30.0–36.0)
MCHC: 31.6 g/dL (ref 30.0–36.0)
MCHC: 29.3 g/dL — ABNORMAL LOW (ref 30.0–36.0)
MCHC: 32.7 g/dL (ref 30.0–36.0)
MCV: 95.2 fL (ref 80.0–100.0)
MCV: 94.1 fL (ref 80.0–100.0)
MCV: 103.6 fL — ABNORMAL HIGH (ref 80.0–100.0)
MCV: 92.7 fL (ref 80.0–100.0)
Platelets: 427 10*3/uL — ABNORMAL HIGH (ref 150–400)
Platelets: 427 10*3/uL — ABNORMAL HIGH (ref 150–400)
Platelets: 498 10*3/uL — ABNORMAL HIGH (ref 150–400)
Platelets: 474 10*3/uL — ABNORMAL HIGH (ref 150–400)
RBC: 2.07 MIL/uL — ABNORMAL LOW (ref 3.87–5.11)
RBC: 2.22 MIL/uL — ABNORMAL LOW (ref 3.87–5.11)
RBC: 2.21 MIL/uL — ABNORMAL LOW (ref 3.87–5.11)
RBC: 2.18 MIL/uL — ABNORMAL LOW (ref 3.87–5.11)
RDW: 14.7 % (ref 11.5–15.5)
RDW: 14.5 % (ref 11.5–15.5)
RDW: 14.7 % (ref 11.5–15.5)
RDW: 14.5 % (ref 11.5–15.5)
WBC: 8.5 10*3/uL (ref 4.0–10.5)
WBC: 8.9 10*3/uL (ref 4.0–10.5)
WBC: 8.4 10*3/uL (ref 4.0–10.5)
WBC: 9 10*3/uL (ref 4.0–10.5)
nRBC: 0 % (ref 0.0–0.2)
nRBC: 0 % (ref 0.0–0.2)
nRBC: 0 % (ref 0.0–0.2)
nRBC: 0 % (ref 0.0–0.2)

## 2021-12-21 LAB — RAPID URINE DRUG SCREEN, HOSP PERFORMED
Amphetamines: NOT DETECTED
Barbiturates: NOT DETECTED
Benzodiazepines: NOT DETECTED
Cocaine: NOT DETECTED
Opiates: NOT DETECTED
Tetrahydrocannabinol: NOT DETECTED

## 2021-12-21 LAB — URINALYSIS, COMPLETE (UACMP) WITH MICROSCOPIC
Bacteria, UA: NONE SEEN
Bilirubin Urine: NEGATIVE
Glucose, UA: NEGATIVE mg/dL
Hgb urine dipstick: NEGATIVE
Ketones, ur: NEGATIVE mg/dL
Leukocytes,Ua: NEGATIVE
Nitrite: NEGATIVE
Protein, ur: 30 mg/dL — AB
Specific Gravity, Urine: 1.024 (ref 1.005–1.030)
pH: 5 (ref 5.0–8.0)

## 2021-12-21 LAB — BASIC METABOLIC PANEL
Anion gap: 8 (ref 5–15)
BUN: 20 mg/dL (ref 6–20)
CO2: 19 mmol/L — ABNORMAL LOW (ref 22–32)
Calcium: 8.8 mg/dL — ABNORMAL LOW (ref 8.9–10.3)
Chloride: 116 mmol/L — ABNORMAL HIGH (ref 98–111)
Creatinine, Ser: 0.68 mg/dL (ref 0.44–1.00)
GFR, Estimated: >60 mL/min (ref >60)
Glucose, Bld: 136 mg/dL — ABNORMAL HIGH (ref 70–99)
Potassium: 3.1 mmol/L — ABNORMAL LOW (ref 3.5–5.1)
Sodium: 143 mmol/L (ref 135–145)

## 2021-12-21 LAB — COMPREHENSIVE METABOLIC PANEL
ALT: 10 U/L (ref 0–44)
AST: 13 U/L — ABNORMAL LOW (ref 15–41)
Albumin: 2.4 g/dL — ABNORMAL LOW (ref 3.5–5.0)
Alkaline Phosphatase: 68 U/L (ref 38–126)
Anion gap: 10 (ref 5–15)
BUN: 25 mg/dL — ABNORMAL HIGH (ref 6–20)
CO2: 18 mmol/L — ABNORMAL LOW (ref 22–32)
Calcium: 8.7 mg/dL — ABNORMAL LOW (ref 8.9–10.3)
Chloride: 116 mmol/L — ABNORMAL HIGH (ref 98–111)
Creatinine, Ser: 0.82 mg/dL (ref 0.44–1.00)
GFR, Estimated: >60 mL/min (ref >60)
Glucose, Bld: 99 mg/dL (ref 70–99)
Potassium: 3.1 mmol/L — ABNORMAL LOW (ref 3.5–5.1)
Sodium: 144 mmol/L (ref 135–145)
Total Bilirubin: 0.4 mg/dL (ref 0.3–1.2)
Total Protein: 6.4 g/dL — ABNORMAL LOW (ref 6.5–8.1)

## 2021-12-21 LAB — RETICULOCYTES
Immature Retic Fract: 16.3 % — ABNORMAL HIGH (ref 2.3–15.9)
RBC.: 2.18 MIL/uL — ABNORMAL LOW (ref 3.87–5.11)
Retic Count, Absolute: 41.6 10*3/uL (ref 19.0–186.0)
Retic Ct Pct: 1.9 % (ref 0.4–3.1)

## 2021-12-21 LAB — C-REACTIVE PROTEIN: CRP: 11.9 mg/dL — ABNORMAL HIGH (ref <1.0)

## 2021-12-21 LAB — AMMONIA: Ammonia: 19 umol/L (ref 9–35)

## 2021-12-21 LAB — HIV ANTIBODY (ROUTINE TESTING W REFLEX): HIV Screen 4th Generation wRfx: NONREACTIVE

## 2021-12-21 LAB — LACTIC ACID, PLASMA: Lactic Acid, Venous: 1.5 mmol/L (ref 0.5–1.9)

## 2021-12-21 LAB — VITAMIN B12: Vitamin B-12: 422 pg/mL (ref 180–914)

## 2021-12-21 LAB — OSMOLALITY, URINE: Osmolality, Ur: 576 mOsm/kg (ref 300–900)

## 2021-12-21 LAB — PREALBUMIN: Prealbumin: 14.3 mg/dL — ABNORMAL LOW (ref 18–38)

## 2021-12-21 LAB — PROCALCITONIN: Procalcitonin: <0.1 ng/mL

## 2021-12-21 LAB — CREATININE, URINE, RANDOM: Creatinine, Urine: 159.08 mg/dL

## 2021-12-21 LAB — FOLATE: Folate: 12.4 ng/mL (ref >5.9)

## 2021-12-21 LAB — SODIUM, URINE, RANDOM: Sodium, Ur: 48 mmol/L

## 2021-12-21 LAB — FERRITIN: Ferritin: 200 ng/mL (ref 11–307)

## 2021-12-21 MED ORDER — PEG 3350-KCL-NA BICARB-NACL 420 G PO SOLR
4000.0000 mL | Freq: Once | ORAL | Status: DC
  Filled 2021-12-21: qty 4000

## 2021-12-21 MED ORDER — SODIUM CHLORIDE 0.9 % IV SOLN
2.0000 g | INTRAVENOUS | Status: DC
  Administered 2021-12-21 – 2021-12-24 (×4): 2 g via INTRAVENOUS
  Filled 2021-12-21 (×4): qty 20

## 2021-12-21 MED ORDER — IOHEXOL 300 MG/ML  SOLN
100.0000 mL | Freq: Once | INTRAMUSCULAR | Status: AC | PRN
Start: 2021-12-21 — End: 2021-12-21
  Administered 2021-12-21: 100 mL via INTRAVENOUS

## 2021-12-21 NOTE — Consult Note (Signed)
[facility] Consult  Referring Provider: [facility]/Dr. Adela Glimpseoutova Primary Care Physician:  Patient, No Pcp Per (Inactive) Primary Gastroenterologist: Gentry FitzUnassigned  Reason for Consultation: Anemia, blood in stool, NSAID use  HPI: Teresa Phillips is a 56 y.o. female reports being in her usual state of health until 10 days ago when she developed swelling of her right leg associated with pain, went to urgent center and was given 10 days of doxycycline.  Patient denies fever, however notes that the swelling in right leg and pain did not improve on oral antibiotics and came to the hospital for further evaluation.  Patient has been taking ibuprofen, 4 to 5 pills at a time every 4-6 hours for the last 10 days along with Excedrin, several pills at a time, several times a day. She denies black stools, however complains of epigastric and upper abdominal discomfort, has noted 2 episodes of bright red blood, tablespoon in amount, for which she took Imodium and has not had a bowel movement since yesterday. Denies nausea, vomiting, acid reflux, heartburn, difficulty swallowing or pain on swallowing. Denies unintentional weight loss or loss of appetite. No prior endoscopy or colonoscopy.  She smokes 1 pack of cigarettes every 3 days. Denies use of alcohol. Denies recreational drug use. Denies family history of GI malignancy or bleeding disorders.   History reviewed. No pertinent past medical history.  History reviewed. No pertinent surgical history.  Prior to Admission medications   Not on File    Current Facility-Administered Medications  Medication Dose Route Frequency Provider Last Rate Last Admin   0.9 %  sodium chloride infusion   Intravenous Continuous Doutova, Anastassia, MD 100 mL/hr at 12/20/21 2256 New Bag at 12/20/21 2256   acetaminophen (TYLENOL) tablet 650 mg  650 mg Oral Q6H PRN Therisa Doyneoutova, Anastassia, MD       Or   acetaminophen (TYLENOL) suppository 650 mg  650 mg Rectal  Q6H PRN Doutova, Anastassia, MD       ceFEPIme (MAXIPIME) 2 g in sodium chloride 0.9 % 100 mL IVPB  2 g Intravenous Q8H Shade, Christine E, RPH 200 mL/hr at 12/21/21 0512 2 g at 12/21/21 0512   fentaNYL (SUBLIMAZE) injection 50 mcg  50 mcg Intravenous Q2H PRN Doutova, Jonny RuizAnastassia, MD       HYDROcodone-acetaminophen (NORCO/VICODIN) 5-325 MG per tablet 1-2 tablet  1-2 tablet Oral Q4H PRN Therisa Doyneoutova, Anastassia, MD   2 tablet at 12/21/21 0509   nicotine (NICODERM CQ - dosed in mg/24 hours) patch 14 mg  14 mg Transdermal Daily Doutova, Anastassia, MD   14 mg at 12/20/21 2257   pantoprazole (PROTONIX) injection 40 mg  40 mg Intravenous Q12H Doutova, Anastassia, MD   40 mg at 12/20/21 2256   [START ON 12/22/2021] polyethylene glycol-electrolytes (NuLYTELY) solution 4,000 mL  4,000 mL Oral Once Kerin SalenKarki, Sameer Teeple, MD       traMADol Janean Sark(ULTRAM) tablet 50 mg  50 mg Oral Q6H PRN Therisa Doyneoutova, Anastassia, MD   50 mg at 12/21/21 0209   vancomycin (VANCOREADY) IVPB 1500 mg/300 mL  1,500 mg Intravenous Q24H Shade, Christine E, RPH        Allergies as of 12/20/2021 - Review Complete 12/20/2021  Allergen Reaction Noted   Avocado Other (See Comments) 06/16/2011   Shellfish allergy Other (See Comments) 06/16/2011    Family History  Problem Relation Age of Onset   Hypertension Mother     Social History   Socioeconomic History   Marital status: Single    Spouse name: Not on file  Number of children: Not on file   Years of education: Not on file   Highest education level: Not on file  Occupational History   Not on file  Tobacco Use   Smoking status: Every Day    Types: Cigarettes   Smokeless tobacco: Never  Substance and Sexual Activity   Alcohol use: No   Drug use: No   Sexual activity: Never    Birth control/protection: None  Other Topics Concern   Not on file  Social History Narrative   Not on file   Social Determinants of Health   Financial Resource Strain: Not on file  Food Insecurity: Not on file   Transportation Needs: Not on file  Physical Activity: Not on file  Stress: Not on file  Social Connections: Not on file  Intimate Partner Violence: Not on file    Review of Systems: Positive for: GI: Described in detail in HPI.    Gen: Denies any fever, chills, rigors, night sweats, anorexia, fatigue, weakness, malaise, involuntary weight loss, and sleep disorder CV: Denies chest pain, angina, palpitations, syncope, orthopnea, PND, peripheral edema, and claudication. Resp: Denies dyspnea, cough, sputum, wheezing, coughing up blood. GU : Denies urinary burning, blood in urine, urinary frequency, urinary hesitancy, nocturnal urination, and urinary incontinence. MS: Right lower leg swelling and pain Derm: Denies rash, itching, oral ulcerations, hives, unhealing ulcers.  Psych: Denies depression, anxiety, memory loss, suicidal ideation, hallucinations,  and confusion. Heme: Denies bruising, bleeding, and enlarged lymph nodes. Neuro:  Denies any headaches, dizziness, paresthesias. Endo:  Denies any problems with DM, thyroid, adrenal function.  Physical Exam: Vital signs in last 24 hours: Temp:  [97.7 F (36.5 C)-98.9 F (37.2 C)] 98.2 F (36.8 C) (06/12 0701) Pulse Rate:  [91-118] 91 (06/12 0701) Resp:  [15-18] 16 (06/12 0701) BP: (130-187)/(61-80) 142/61 (06/12 0701) SpO2:  [93 %-100 %] 97 % (06/12 0640) Last BM Date : 12/19/21  General:   Alert,  Well-developed, overweight, pleasant and cooperative in NAD Head:  Normocephalic and atraumatic. Eyes:  Sclera clear, no icterus.   Prominent pallor  Ears:  Normal auditory acuity. Nose:  No deformity, discharge,  or lesions. Mouth:  No deformity or lesions.  Oropharynx pink & moist. Neck:  Supple; no masses or thyromegaly. Lungs:  Clear throughout to auscultation.   No wheezes, crackles, or rhonchi. No acute distress. Heart:  Regular rate and rhythm; no murmurs, clicks, rubs,  or gallops. Extremities: Right lower leg swollen and warm   Neurologic:  Alert and  oriented x4;  grossly normal neurologically. Skin:  Intact without significant lesions or rashes. Psych:  Alert and cooperative. Normal mood and affect. Abdomen:  Soft, nontender and nondistended. No masses, hepatosplenomegaly or hernias noted. Normal bowel sounds, without guarding, and without rebound.         Lab Results: Recent Labs    12/20/21 1517 12/20/21 2045 12/21/21 0555  WBC 12.0* 9.5 8.5  HGB 7.8* 7.1* 6.2*  HCT 24.6* 22.0* 19.7*  PLT 549* 522* 427*   BMET Recent Labs    12/20/21 1517 12/20/21 2045 12/21/21 0555  NA 142 142 144  K 2.8* 2.8* 3.1*  CL 114* 114* 116*  CO2 20* 22 18*  GLUCOSE 112* 107* 99  BUN 26* 26* 25*  CREATININE 1.01* 0.83 0.82  CALCIUM 9.1 8.7* 8.7*   LFT Recent Labs    12/21/21 0555  PROT 6.4*  ALBUMIN 2.4*  AST 13*  ALT 10  ALKPHOS 68  BILITOT 0.4   PT/INR  Recent Labs    12/20/21 2027  LABPROT 15.3*  INR 1.2    Studies/Results: CT TIBIA FIBULA RIGHT W CONTRAST  Result Date: 12/21/2021 CLINICAL DATA:  Concern for osteomyelitis. EXAM: CT OF THE LOWER RIGHT EXTREMITY WITH CONTRAST TECHNIQUE: Multidetector CT imaging of the lower right extremity was performed according to the standard protocol following intravenous contrast administration. RADIATION DOSE REDUCTION: This exam was performed according to the departmental dose-optimization program which includes automated exposure control, adjustment of the mA and/or kV according to patient size and/or use of iterative reconstruction technique. CONTRAST:  OMNIPAQUE IOHEXOL 300 MG/ML  SOLN COMPARISON:  Radiograph dated 12/20/2021. FINDINGS: Bones/Joint/Cartilage There is no acute fracture or dislocation. The bones are osteopenic. 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 There is a moderate suprapatellar effusion. There are degenerative changes of the tarsal joints with  cortical irregularity. Ligaments Suboptimally assessed by CT. Muscles and Tendons No intramuscular fluid collection. Soft tissues There is diffuse skin thickening and subcutaneous edema which may represent cellulitis. Clinical correlation is recommended. No drainable fluid collection/abscess. No soft tissue gas. IMPRESSION: 1. No acute fracture or dislocation. 2. Arthritic changes of the right knee with a moderate suprapatellar effusion. 3. Diffuse skin thickening and subcutaneous edema which may represent cellulitis. No drainable fluid collection/abscess. Electronically Signed   By: Elgie Collard M.D.   On: 12/21/2021 03:07   DG Tibia/Fibula Right  Result Date: 12/20/2021 CLINICAL DATA:  Right lower extremity pain and swelling. EXAM: RIGHT TIBIA AND FIBULA - 2 VIEW; RIGHT FOOT - 2 VIEW COMPARISON:  None Available. FINDINGS: There is no acute fracture or dislocation. Mild arthritic changes of the right knee and degenerative changes of the tarsal joints with irregularity and spurring. There is degenerative changes of the first MTP joint with joint space narrowing and spurring. There is diffuse subcutaneous edema and soft tissue swelling of the dorsum of the foot. No radiopaque foreign object or soft tissue gas. IMPRESSION: 1. No acute fracture or dislocation. 2. Diffuse subcutaneous edema and soft tissue swelling of the dorsum of the foot. Electronically Signed   By: Elgie Collard M.D.   On: 12/20/2021 21:28   DG Foot 2 Views Right  Result Date: 12/20/2021 CLINICAL DATA:  Right lower extremity pain and swelling. EXAM: RIGHT TIBIA AND FIBULA - 2 VIEW; RIGHT FOOT - 2 VIEW COMPARISON:  None Available. FINDINGS: There is no acute fracture or dislocation. Mild arthritic changes of the right knee and degenerative changes of the tarsal joints with irregularity and spurring. There is degenerative changes of the first MTP joint with joint space narrowing and spurring. There is diffuse subcutaneous edema and soft  tissue swelling of the dorsum of the foot. No radiopaque foreign object or soft tissue gas. IMPRESSION: 1. No acute fracture or dislocation. 2. Diffuse subcutaneous edema and soft tissue swelling of the dorsum of the foot. Electronically Signed   By: Elgie Collard M.D.   On: 12/20/2021 21:28   DG Chest 2 View  Result Date: 12/20/2021 CLINICAL DATA:  Swelling, pain in right leg. EXAM: CHEST - 2 VIEW COMPARISON:  01/15/2019 FINDINGS: The heart size and mediastinal contours are within normal limits. Both lungs are clear. The visualized skeletal structures are unremarkable. IMPRESSION: Normal study. Electronically Signed   By: Charlett Nose M.D.   On: 12/20/2021 21:28   VAS [place] LOWER EXTREMITY VENOUS (DVT) (7a-7p)  Result Date: 12/20/2021  Lower Venous DVT Study Patient Name:  Kaela C  Schunk  Date of Exam:   12/20/2021 Medical Rec #: [id number]     Accession #:    [id number] Date of Birth: 1965-09-05     Patient Gender: F Patient Age:   59 years Exam Location:  [facility] Procedure:      VAS [place] LOWER EXTREMITY VENOUS (DVT) Referring Phys: Velda Shell BLUE --------------------------------------------------------------------------------  Indications: Pain, Swelling, and Erythema. Other Indications: Patient diagnosed with cellulitis last week (compliant with                    antibiotics) with no relief in symptoms. Comparison Study: No previous exams Performing Technologist: Jody Hill RVT, RDMS  Examination Guidelines: A complete evaluation includes B-mode imaging, spectral Doppler, color Doppler, and power Doppler as needed of all accessible portions of each vessel. Bilateral testing is considered an integral part of a complete examination. Limited examinations for reoccurring indications may be performed as noted. The reflux portion of the exam is performed with the patient in reverse Trendelenburg.  +---------+---------------+---------+-----------+----------+-------------------+ RIGHT     CompressibilityPhasicitySpontaneityPropertiesThrombus Aging      +---------+---------------+---------+-----------+----------+-------------------+ CFV      Full           Yes      Yes                                      +---------+---------------+---------+-----------+----------+-------------------+ SFJ      Full                                                             +---------+---------------+---------+-----------+----------+-------------------+ FV Prox  Full           Yes      Yes                                      +---------+---------------+---------+-----------+----------+-------------------+ FV Mid   Full           Yes      Yes                                      +---------+---------------+---------+-----------+----------+-------------------+ FV DistalFull           Yes      Yes                                      +---------+---------------+---------+-----------+----------+-------------------+ PFV      Full                                                             +---------+---------------+---------+-----------+----------+-------------------+ POP      Full           Yes      Yes                                      +---------+---------------+---------+-----------+----------+-------------------+  PTV      Full                                                             +---------+---------------+---------+-----------+----------+-------------------+ PERO                                                  Not well visualized +---------+---------------+---------+-----------+----------+-------------------+   +----+---------------+---------+-----------+----------+--------------+ LEFTCompressibilityPhasicitySpontaneityPropertiesThrombus Aging +----+---------------+---------+-----------+----------+--------------+ CFV Full           Yes      Yes                                  +----+---------------+---------+-----------+----------+--------------+     Summary: RIGHT: - There is no evidence of deep vein thrombosis in the lower extremity.  - No cystic structure found in the popliteal fossa. - Subcutaneous edema in area of calf & popliteal fossa. - Ultrasound characteristics of enlarged lymph nodes are noted in the groin.  LEFT: - No evidence of common femoral vein obstruction.  *See table(s) above for measurements and observations.    Preliminary     Impression: Anemia, 2 episodes of bright red blood in stool, significant NSAID use in the last 10 days Elevated BUN/creatinine ratio, 25/0.82 Hemoglobin 6.2(was 7.8 on admission, 14.6 in 5/21), normocytic, platelet 427?  Reactive thrombocytosis  Right lower extremity cellulitis, no DVT-on IV vancomycin and IV cefepime On Norco/Vicodin, fentanyl and tramadol for pain control along with acetaminophen as needed  Plan: Discussed about EGD and colonoscopy with the patient. Patient is very hesitant regarding having additional procedures, wants to think about it for at least a day, discussed with the family members before consenting. She was very hesitant receiving PRBC transfusion as well. We will start patient on clear liquid diet, tentatively scheduled for an EGD and colonoscopy on Wednesday. Continue pantoprazole 40 mg every 12 hours.   LOS: 1 day   Kerin Salen, MD  12/21/2021, 8:56 AM

## 2021-12-21 NOTE — Plan of Care (Signed)
  Problem: Education: Goal: Knowledge of General Education information will improve Description: Including pain rating scale, medication(s)/side effects and non-pharmacologic comfort measures Outcome: Progressing   Problem: Health Behavior/Discharge Planning: Goal: Ability to manage health-related needs will improve Outcome: Progressing   Problem: Clinical Measurements: Goal: Ability to maintain clinical measurements within normal limits will improve Outcome: Progressing Goal: Will remain free from infection Outcome: Progressing Goal: Diagnostic test results will improve Outcome: Progressing Goal: Respiratory complications will improve Outcome: Progressing Goal: Cardiovascular complication will be avoided Outcome: Progressing   Problem: Activity: Goal: Risk for activity intolerance will decrease Outcome: Progressing   Problem: Nutrition: Goal: Adequate nutrition will be maintained Outcome: Progressing   Problem: Coping: Goal: Level of anxiety will decrease Outcome: Progressing   Problem: Elimination: Goal: Will not experience complications related to bowel motility Outcome: Progressing Goal: Will not experience complications related to urinary retention Outcome: Progressing   Problem: Safety: Goal: Ability to remain free from injury will improve Outcome: Progressing   Problem: Skin Integrity: Goal: Risk for impaired skin integrity will decrease Outcome: Progressing   Problem: Clinical Measurements: Goal: Ability to avoid or minimize complications of infection will improve Outcome: Progressing   Problem: Skin Integrity: Goal: Skin integrity will improve Outcome: Progressing   Problem: Fluid Volume: Goal: Hemodynamic stability will improve Outcome: Progressing   Problem: Clinical Measurements: Goal: Diagnostic test results will improve Outcome: Progressing Goal: Signs and symptoms of infection will decrease Outcome: Progressing   Problem:  Respiratory: Goal: Ability to maintain adequate ventilation will improve Outcome: Progressing   Pt rating pain 5/10 in right lower leg.  PRNs administered per MAR.  Pt instructed to keep right leg elevated.

## 2021-12-21 NOTE — Progress Notes (Signed)
Order placed for patient to be transfused 1 unit of blood. MD previously spoke with patient about blood transfusion. RN attempted to get patient to sign consent for blood transfusion and patient declines at this time. Patient said she would like to think about it further as she has never had a transfusion before. RN reiterated to patient that her hemoglobin was 7.1 and that she was symptomatic. Patient still refuses blood transfusion at this time. MD A.Doutova made aware. Patient stated that is she did receive blood transfusion that it would not be during night shift.Patient stable at this time. Vitals are within defined limits.

## 2021-12-21 NOTE — Progress Notes (Signed)
[facility] Progress Note  Patient: Teresa Phillips     OIN:[id number]  DOA: 12/20/2021   PCP: Patient, No Pcp Per (Inactive)       Brief hospital course: This is a 56 year old female with hypertension who  has insurance but does not see a PCP. She presents to the hospital with right leg swelling and pain. She has no PCP. The patient states that 2 weeks ago the right leg became swollen and blistered.  She was given 10 days of doxycycline 1 at an urgent care and took the entire course but this did not help.  Blisters have been draining and the leg is still swollen and very painful.  She has been applying topical Neosporin.  She has been taking ibuprofen and Excedrin for pain.  She has been vomiting.  She also reported some blood in the urine that occurred the day before she came to the ED.   In the ED: Noted to have 3+ edema of the right leg.  Venous duplex was negative for DVT.  X-ray of the foot and leg revealed subcutaneous edema and soft tissue swelling.  She was started on cefepime and vancomycin. She was also noted to have a hemoglobin of 7.8 and a potassium of 2.8.  Subjective:  Left leg pain is the same. No improvement in leg.  Assessment and Plan: Principal Problem:   Cellulitis of right leg - failed outpatient treatment with doxycycline - Negative for DVT and negative for bony abnormalities per x-ray - CRP 11.9, ESR greater than 150 - CT does not show an abscess- we will dc Vanc and change Cefepime to Ceftriaxone- f/u blood cx - have asked patient and RN to elevate the leg above the abdomen - on Tramadol, Fentanyl IV and Hydrocodone PRN  Active Problems:  AKI - Creatinine in 2021 was 0.7 - Presented with a creatinine of 1.01 which has improved to 0.82    Normocytic anemia - noted a small amount of BRB in stool once a couple of days ago- -Hemoglobin was 7.8 when admitted-the last hemoglobin we have prior to this was 14.6 in 2021 - Hemoglobin is 6.2 @ 12 AM -BUN  mildly elevated at 25 -Anemia panel reviewed-iron is 21 and slightly low, iron saturation is 11 and normal, ferritin is 191 and normal, folate is 10.5 and normal, vitamin B12 is 561 and normal - Patient declined 1 unit of packed red blood cells that was ordered last night- she accepted it this AM -Receiving Protonix - never had a colonoscopy- GI is consulting  Thrombocytosis - Platelets are 427 today improved from 549 when she was admitted    Hypokalemia -Potassium improved from 2.8-3.1 today-continue to replace - Magnesium normal at 1.8     Essential hypertension -  follow    Tobacco abuse  - Nicotine patch     DVT prophylaxis:  SCDs Start: 12/20/21 2352   Code Status: Full Code  Consultants: none Level of Care: Level of care: Telemetry Disposition Plan:  Status is: Inpatient Remains inpatient appropriate because: cellulitis failed outpt antibiotics  Objective:   Vitals:   12/20/21 2306 12/21/21 0443 12/21/21 0640 12/21/21 0701  BP: 131/61 133/78 130/67 (!) 142/61  Pulse: 97 96 95 91  Resp: _0 Temp: 98.4 F (36.9 C) 97.7 F (36.5 C) 97.9 F (36.6 C) 98.2 F (36.8 C)  TempSrc: Oral Oral Oral   SpO2: 100% 99% 97%    There were no vitals filed for this visit.  Exam: General exam: Appears comfortable  HEENT: PERRLA, oral mucosa moist, no sclera icterus or thrush Respiratory system: Clear to auscultation. Respiratory effort normal. Cardiovascular system: S1 & S2 heard, regular rate and rhythm Gastrointestinal system: Abdomen soft, non-tender, nondistended. Normal bowel sounds   Central nervous system: Alert and oriented. No focal neurological deficits. Extremities: No cyanosis, clubbing - severe edema of knee and the lower leg and foot- erythema and tenderness present Skin: No rashes or ulcers Psychiatry:  Mood & affect appropriate.    Imaging and lab data was personally reviewed    CBC: Recent Labs  Lab 12/20/21 1517 12/20/21 2045 12/21/21 0555   WBC 12.0* 9.5 8.5  NEUTROABS 9.5*  --   --   HGB 7.8* 7.1* 6.2*  HCT 24.6* 22.0* 19.7*  MCV 92.8 92.1 95.2  PLT 549* 522* 665*   Basic Metabolic Panel: Recent Labs  Lab 12/20/21 1517 12/20/21 2027 12/20/21 2045 12/21/21 0555  NA 142  --  142 144  K 2.8*  --  2.8* 3.1*  CL 114*  --  114* 116*  CO2 20*  --  22 18*  GLUCOSE 112*  --  107* 99  BUN 26*  --  26* 25*  CREATININE 1.01*  --  0.83 0.82  CALCIUM 9.1  --  8.7* 8.7*  MG  --  1.8  --   --   PHOS  --  3.9  --   --    GFR: Estimated Creatinine Clearance: 97.2 mL/min (by C-G formula based on SCr of 0.82 mg/dL).  Scheduled Meds:  nicotine  14 mg Transdermal Daily   pantoprazole (PROTONIX) IV  40 mg Intravenous Q12H   Continuous Infusions:  sodium chloride 100 mL/hr at 12/20/21 2256   ceFEPime (MAXIPIME) IV 2 g (12/21/21 0512)   vancomycin       LOS: 1 day   Author: Debbe Odea  12/21/2021 8:27 AM

## 2021-12-22 DIAGNOSIS — D649 Anemia, unspecified: Secondary | ICD-10-CM

## 2021-12-22 DIAGNOSIS — E876 Hypokalemia: Secondary | ICD-10-CM | POA: Diagnosis not present

## 2021-12-22 DIAGNOSIS — L03115 Cellulitis of right lower limb: Principal | ICD-10-CM

## 2021-12-22 DIAGNOSIS — I10 Essential (primary) hypertension: Secondary | ICD-10-CM | POA: Diagnosis not present

## 2021-12-22 LAB — CBC
HCT: 19.1 % — ABNORMAL LOW (ref 36.0–46.0)
HCT: 23.5 % — ABNORMAL LOW (ref 36.0–46.0)
Hemoglobin: 6.1 g/dL — CL (ref 12.0–15.0)
Hemoglobin: 7.7 g/dL — ABNORMAL LOW (ref 12.0–15.0)
MCH: 30 pg (ref 26.0–34.0)
MCH: 29.6 pg (ref 26.0–34.0)
MCHC: 31.9 g/dL (ref 30.0–36.0)
MCHC: 32.8 g/dL (ref 30.0–36.0)
MCV: 94.1 fL (ref 80.0–100.0)
MCV: 90.4 fL (ref 80.0–100.0)
Platelets: 378 10*3/uL (ref 150–400)
Platelets: 428 10*3/uL — ABNORMAL HIGH (ref 150–400)
RBC: 2.03 MIL/uL — ABNORMAL LOW (ref 3.87–5.11)
RBC: 2.6 MIL/uL — ABNORMAL LOW (ref 3.87–5.11)
RDW: 14.5 % (ref 11.5–15.5)
RDW: 14.3 % (ref 11.5–15.5)
WBC: 8.9 10*3/uL (ref 4.0–10.5)
WBC: 9.4 10*3/uL (ref 4.0–10.5)
nRBC: 0 % (ref 0.0–0.2)
nRBC: 0 % (ref 0.0–0.2)

## 2021-12-22 LAB — TYPE AND SCREEN
ABO/RH(D): O POS
Antibody Screen: NEGATIVE
Unit division: 0

## 2021-12-22 LAB — BPAM RBC
Blood Product Expiration Date: 202307062359
ISSUE DATE / TIME: 202306120638
Unit Type and Rh: 5100

## 2021-12-22 LAB — COMPREHENSIVE METABOLIC PANEL
ALT: 10 U/L (ref 0–44)
AST: 11 U/L — ABNORMAL LOW (ref 15–41)
Albumin: 2.2 g/dL — ABNORMAL LOW (ref 3.5–5.0)
Alkaline Phosphatase: 73 U/L (ref 38–126)
Anion gap: 6 (ref 5–15)
BUN: 16 mg/dL (ref 6–20)
CO2: 21 mmol/L — ABNORMAL LOW (ref 22–32)
Calcium: 8.6 mg/dL — ABNORMAL LOW (ref 8.9–10.3)
Chloride: 116 mmol/L — ABNORMAL HIGH (ref 98–111)
Creatinine, Ser: 0.6 mg/dL (ref 0.44–1.00)
GFR, Estimated: >60 mL/min (ref >60)
Glucose, Bld: 94 mg/dL (ref 70–99)
Potassium: 3.2 mmol/L — ABNORMAL LOW (ref 3.5–5.1)
Sodium: 143 mmol/L (ref 135–145)
Total Bilirubin: 0.5 mg/dL (ref 0.3–1.2)
Total Protein: 6.4 g/dL — ABNORMAL LOW (ref 6.5–8.1)

## 2021-12-22 LAB — LACTATE DEHYDROGENASE: LDH: 114 U/L (ref 98–192)

## 2021-12-22 LAB — PREPARE RBC (CROSSMATCH)

## 2021-12-22 MED ORDER — JUVEN PO PACK
1.0000 | PACK | Freq: Two times a day (BID) | ORAL | Status: DC
  Administered 2021-12-22 – 2021-12-25 (×3): 1 via ORAL
  Filled 2021-12-22 (×7): qty 1

## 2021-12-22 MED ORDER — SODIUM CHLORIDE 0.9 % IV SOLN: INTRAVENOUS | Status: DC

## 2021-12-22 MED ORDER — PEG 3350-KCL-NA BICARB-NACL 420 G PO SOLR
2000.0000 mL | Freq: Once | ORAL | Status: AC
  Administered 2021-12-22: 2000 mL via ORAL
  Filled 2021-12-22: qty 4000

## 2021-12-22 MED ORDER — ADULT MULTIVITAMIN W/MINERALS CH
1.0000 | ORAL_TABLET | Freq: Every day | ORAL | Status: DC
  Administered 2021-12-22 – 2021-12-25 (×4): 1 via ORAL
  Filled 2021-12-22 (×4): qty 1

## 2021-12-22 MED ORDER — SODIUM CHLORIDE 0.9% IV SOLUTION: Freq: Once | INTRAVENOUS | Status: AC

## 2021-12-22 MED ORDER — POTASSIUM CHLORIDE CRYS ER 20 MEQ PO TBCR
40.0000 meq | EXTENDED_RELEASE_TABLET | ORAL | Status: AC
  Administered 2021-12-22 (×2): 40 meq via ORAL
  Filled 2021-12-22 (×2): qty 2

## 2021-12-22 MED ORDER — PEG 3350-KCL-NA BICARB-NACL 420 G PO SOLR
2000.0000 mL | Freq: Once | ORAL | Status: AC
  Administered 2021-12-23: 2000 mL via ORAL

## 2021-12-22 MED ORDER — PEG 3350-KCL-NA BICARB-NACL 420 G PO SOLR: 4000.0000 mL | Freq: Once | ORAL | Status: DC

## 2021-12-22 NOTE — H&P (View-Only) (Signed)
[facility] Gastroenterology Progress Note  Teresa Phillips 56 y.o. [birth year]   Subjective: Patient seen and examined laying in bed. Reports one episodes of darker red blood in stool last night. Denies abdominal pain. Has been tolerating diet well.   ROS : Review of Systems  Gastrointestinal:  Negative for abdominal pain, blood in stool, constipation, diarrhea, heartburn, melena, nausea and vomiting.  Genitourinary:  Negative for dysuria and urgency.      Objective: Vital signs in last 24 hours: Vitals:   12/21/21 1435 12/22/21 0610  BP: 140/73 (!) 145/77  Pulse: 97 100  Resp:  18  Temp: 98 F (36.7 C) 98.5 F (36.9 C)  SpO2: 100% 100%    Physical Exam:  General:  Alert, cooperative, no distress, appears stated age  Head:  Normocephalic, without obvious abnormality, atraumatic  Eyes:  Anicteric sclera, EOM's intact  Lungs:   Clear to auscultation bilaterally, respirations unlabored  Heart:  Regular rate and rhythm, S1, S2 normal  Abdomen:   Soft, non-tender, bowel sounds active all four quadrants,  no masses,   Extremities: Extremities normal, atraumatic, no  edema  Pulses: 2+ and symmetric    Lab Results: Recent Labs    12/20/21 2027 12/20/21 2045 12/21/21 0555 12/21/21 1406  NA  --    < > 144 143  K  --    < > 3.1* 3.1*  CL  --    < > 116* 116*  CO2  --    < > 18* 19*  GLUCOSE  --    < > 99 136*  BUN  --    < > 25* 20  CREATININE  --    < > 0.82 0.68  CALCIUM  --    < > 8.7* 8.8*  MG 1.8  --   --   --   PHOS 3.9  --   --   --    < > = values in this interval not displayed.   Recent Labs    12/20/21 1517 12/21/21 0555  AST 13* 13*  ALT 10 10  ALKPHOS 71 68  BILITOT 0.6 0.4  PROT 7.4 6.4*  ALBUMIN 2.7* 2.4*   Recent Labs    12/20/21 1517 12/20/21 2045 12/21/21 2145 12/22/21 0522  WBC 12.0*   < > 9.0 8.9  NEUTROABS 9.5*  --   --   --   HGB 7.8*   < > 6.6* 6.1*  HCT 24.6*   < > 20.2* 19.1*  MCV 92.8   < > 92.7 94.1  PLT 549*   < > 474* 378   < >  = values in this interval not displayed.   Recent Labs    12/20/21 2027  LABPROT 15.3*  INR 1.2      Assessment Anemia Hematochezia NSAID use  HGB 6.1(6.6) Platelets 378 Received one unit of pRBC on 6/12  Notes no further bright red blood in stool. Stool now darker red and smaller in volume. Hgb continues to decrease despite blood transfusion. Discussed EGD and colonoscopy with patient again and she is agreeable to procedures.    Plan: Plan for EGD/Colonoscopy tomorrow. I thoroughly discussed the procedures to include nature, alternatives, benefits, and risks including but not limited to bleeding, perforation, infection, anesthesia/cardiac and pulmonary complications. Patient provides understanding and gave verbal consent to proceed. Continue Protonix 40 mg IV BID. Nulytely prep, clear liquid diet, NPO at midnight. Continue daily CBC with transfusion as needed to maintain Hgb >7.  [facility]  will follow.    Arvella Nigh Harce Volden PA-C 12/22/2021, 9:14 AM  Contact #  [phone]

## 2021-12-22 NOTE — Progress Notes (Signed)
Initial Nutrition Assessment  DOCUMENTATION CODES:   Obesity unspecified  INTERVENTION:   -1 packet Juven BID, each packet provides 95 calories, 2.5 grams of protein (collagen), and 9.8 grams of carbohydrate (3 grams sugar); also contains 7 grams of L-arginine and L-glutamine, 300 mg vitamin C, 15 mg vitamin E, 1.2 mcg vitamin B-12, 9.5 mg zinc, 200 mg calcium, and 1.5 g  Calcium Beta-hydroxy-Beta-methylbutyrate to support wound healing   -Multivitamin with minerals daily   NUTRITION DIAGNOSIS:   Increased nutrient needs related to wound healing as evidenced by estimated needs.  GOAL:   Patient will meet greater than or equal to 90% of their needs  MONITOR:   PO intake, Supplement acceptance, Labs, Weight trends, I & O's  REASON FOR ASSESSMENT:   Consult Assessment of nutrition requirement/status  ASSESSMENT:   56 y.o. female reports being in her usual state of health until 10 days ago when she developed swelling of her right leg associated with pain, went to urgent center and was given 10 days of doxycycline  Patient unavailable to see, receiving patient care. Per RN notes, having bloody stools today. Pt on clear liquids. Scheduled for EGD and colonoscopy 6/14.   Per chart review, pt has has had poor PO intakes. Right leg cellulitis began around 2 weeks PTA. Will add daily MVI and Juven at this time to aid in wound healing.   Per weight records, no weight for this admission available. Last weight is 241 lbs from 6/1.  Medications reviewed.  Labs reviewed:  Low K  NUTRITION - FOCUSED PHYSICAL EXAM:  Unable to complete  Diet Order:   Diet Order             Diet NPO time specified  Diet effective midnight           Diet clear liquid Room service appropriate? Yes; Fluid consistency: Thin  Diet effective now                   EDUCATION NEEDS:   Not appropriate for education at this time  Skin:  Skin Assessment: Skin Integrity Issues: Skin Integrity  Issues:: Other (Comment) Other: right leg cellulitis  Last BM:  6/13  Height:   Ht Readings from Last 1 Encounters:  12/10/21 5\' 8"  (1.727 m)    Weight:   Wt Readings from Last 1 Encounters:  12/10/21 109.8 kg    BMI:  36 kg/m^2  Estimated Nutritional Needs:   Kcal:  1600-1800  Protein:  80-95g  Fluid:  1.8L/day  02/09/22, MS, RD, LDN Inpatient Clinical Dietitian Contact information available via Amion

## 2021-12-22 NOTE — Plan of Care (Signed)
Pt reporting pain 5/10 in right leg.  BM this morning with red blood, clots.  Hgb 6.1  1 unit RBCs tranfusing at this time.  EGD and colonscopy planned for tomorrow 6/14.  Problem: Education: Goal: Knowledge of General Education information will improve Description: Including pain rating scale, medication(s)/side effects and non-pharmacologic comfort measures Outcome: Progressing   Problem: Health Behavior/Discharge Planning: Goal: Ability to manage health-related needs will improve Outcome: Progressing   Problem: Clinical Measurements: Goal: Ability to maintain clinical measurements within normal limits will improve Outcome: Progressing Goal: Will remain free from infection Outcome: Progressing Goal: Diagnostic test results will improve Outcome: Progressing Goal: Respiratory complications will improve Outcome: Progressing Goal: Cardiovascular complication will be avoided Outcome: Progressing   Problem: Activity: Goal: Risk for activity intolerance will decrease Outcome: Progressing   Problem: Nutrition: Goal: Adequate nutrition will be maintained Outcome: Progressing   Problem: Coping: Goal: Level of anxiety will decrease Outcome: Progressing   Problem: Elimination: Goal: Will not experience complications related to bowel motility Outcome: Progressing Goal: Will not experience complications related to urinary retention Outcome: Progressing   Problem: Safety: Goal: Ability to remain free from injury will improve Outcome: Progressing   Problem: Skin Integrity: Goal: Risk for impaired skin integrity will decrease Outcome: Progressing   Problem: Clinical Measurements: Goal: Ability to avoid or minimize complications of infection will improve Outcome: Progressing   Problem: Skin Integrity: Goal: Skin integrity will improve Outcome: Progressing   Problem: Fluid Volume: Goal: Hemodynamic stability will improve Outcome: Progressing   Problem: Clinical  Measurements: Goal: Diagnostic test results will improve Outcome: Progressing Goal: Signs and symptoms of infection will decrease Outcome: Progressing   Problem: Respiratory: Goal: Ability to maintain adequate ventilation will improve Outcome: Progressing

## 2021-12-22 NOTE — Progress Notes (Signed)
[facility] Gastroenterology Progress Note  Teresa Phillips 56 y.o. 1965/07/20   Subjective: Patient seen and examined laying in bed. Reports one episodes of darker red blood in stool last night. Denies abdominal pain. Has been tolerating diet well.   ROS : Review of Systems  Gastrointestinal:  Negative for abdominal pain, blood in stool, constipation, diarrhea, heartburn, melena, nausea and vomiting.  Genitourinary:  Negative for dysuria and urgency.      Objective: Vital signs in last 24 hours: Vitals:   12/21/21 1435 12/22/21 0610  BP: 140/73 (!) 145/77  Pulse: 97 100  Resp:  18  Temp: 98 F (36.7 C) 98.5 F (36.9 C)  SpO2: 100% 100%    Physical Exam:  General:  Alert, cooperative, no distress, appears stated age  Head:  Normocephalic, without obvious abnormality, atraumatic  Eyes:  Anicteric sclera, EOM's intact  Lungs:   Clear to auscultation bilaterally, respirations unlabored  Heart:  Regular rate and rhythm, S1, S2 normal  Abdomen:   Soft, non-tender, bowel sounds active all four quadrants,  no masses,   Extremities: Extremities normal, atraumatic, no  edema  Pulses: 2+ and symmetric    Lab Results: Recent Labs    12/20/21 2027 12/20/21 2045 12/21/21 0555 12/21/21 1406  NA  --    < > 144 143  K  --    < > 3.1* 3.1*  CL  --    < > 116* 116*  CO2  --    < > 18* 19*  GLUCOSE  --    < > 99 136*  BUN  --    < > 25* 20  CREATININE  --    < > 0.82 0.68  CALCIUM  --    < > 8.7* 8.8*  MG 1.8  --   --   --   PHOS 3.9  --   --   --    < > = values in this interval not displayed.   Recent Labs    12/20/21 1517 12/21/21 0555  AST 13* 13*  ALT 10 10  ALKPHOS 71 68  BILITOT 0.6 0.4  PROT 7.4 6.4*  ALBUMIN 2.7* 2.4*   Recent Labs    12/20/21 1517 12/20/21 2045 12/21/21 2145 12/22/21 0522  WBC 12.0*   < > 9.0 8.9  NEUTROABS 9.5*  --   --   --   HGB 7.8*   < > 6.6* 6.1*  HCT 24.6*   < > 20.2* 19.1*  MCV 92.8   < > 92.7 94.1  PLT 549*   < > 474* 378   < >  = values in this interval not displayed.   Recent Labs    12/20/21 2027  LABPROT 15.3*  INR 1.2      Assessment Anemia Hematochezia NSAID use  HGB 6.1(6.6) Platelets 378 Received one unit of pRBC on 6/12  Notes no further bright red blood in stool. Stool now darker red and smaller in volume. Hgb continues to decrease despite blood transfusion. Discussed EGD and colonoscopy with patient again and she is agreeable to procedures.    Plan: Plan for EGD/Colonoscopy tomorrow. I thoroughly discussed the procedures to include nature, alternatives, benefits, and risks including but not limited to bleeding, perforation, infection, anesthesia/cardiac and pulmonary complications. Patient provides understanding and gave verbal consent to proceed. Continue Protonix 40 mg IV BID. Nulytely prep, clear liquid diet, NPO at midnight. Continue daily CBC with transfusion as needed to maintain Hgb >7.  [facility]  will follow.    Arvella Nigh Dannetta Lekas PA-C 12/22/2021, 9:14 AM  Contact #  [phone]

## 2021-12-22 NOTE — Anesthesia Preprocedure Evaluation (Signed)
Anesthesia Evaluation  Patient identified by MRN, date of birth, ID band  Reviewed: Allergy & Precautions, NPO status , Patient's Chart, lab work & pertinent test results  Airway Mallampati: II  TM Distance: >3 FB Neck ROM: Full    Dental no notable dental hx. (+) Edentulous Upper, Dental Advisory Given   Pulmonary Current Smoker and Patient abstained from smoking.,    Pulmonary exam normal breath sounds clear to auscultation       Cardiovascular hypertension, Normal cardiovascular exam Rhythm:Regular Rate:Normal     Neuro/Psych    GI/Hepatic   Endo/Other    Renal/GU Renal disease     Musculoskeletal   Abdominal (+) + obese,   Peds  Hematology  (+) Blood dyscrasia, anemia ,   Anesthesia Other Findings   Reproductive/Obstetrics                            Anesthesia Physical Anesthesia Plan  ASA: 3  Anesthesia Plan: MAC   Post-op Pain Management:    Induction:   PONV Risk Score and Plan: Treatment may vary due to age or medical condition  Airway Management Planned: Natural Airway and Nasal Cannula  Additional Equipment: None  Intra-op Plan:   Post-operative Plan:   Informed Consent: I have reviewed the patients History and Physical, chart, labs and discussed the procedure including the risks, benefits and alternatives for the proposed anesthesia with the patient or authorized representative who has indicated his/her understanding and acceptance.     Dental advisory given  Plan Discussed with:   Anesthesia Plan Comments: (Anemia Hematachezia)       Anesthesia Quick Evaluation

## 2021-12-22 NOTE — Progress Notes (Signed)
Pt BM this morning with significant amount of blood (dark and bright red blood visible with multiple clots).  MD notified. Pt to receive 1 unit RBCs today and EGD/colonscopy tomorrow.

## 2021-12-22 NOTE — Progress Notes (Signed)
[facility] Progress Note  Patient: Teresa Phillips     TIR:[id number]  DOA: 12/20/2021   PCP: Patient, No Pcp Per (Inactive)       Brief hospital course: This is a 56 year old female with hypertension who  has insurance but does not see a PCP. She presents to the hospital with right leg swelling and pain. She has no PCP. The patient states that 2 weeks ago the right leg became swollen and blistered.  She was given 10 days of doxycycline 1 at an urgent care and took the entire course but this did not help.  Blisters have been draining and the leg is still swollen and very painful.  She has been applying topical Neosporin.  She has been taking ibuprofen and Excedrin for pain.  She has been vomiting.  She also reported some blood in the urine that occurred the day before she came to the ED.   In the ED: Noted to have 3+ edema of the right leg.  Venous duplex was negative for DVT.  X-ray of the foot and leg revealed subcutaneous edema and soft tissue swelling.  She was started on cefepime and vancomycin. She was also noted to have a hemoglobin of 7.8 and a potassium of 2.8. Initially declined a blood transfusion but the following day when hemoglobin was 6.2, she agreed to transfusion. Yesterday she declined EGD and colonoscopy but she has agreed to them today.  Subjective:    She continues to have bright red blood in her stools. No abdominal pain. Her leg is slightly less swollen.  Assessment and Plan: Principal Problem:   Cellulitis of right leg - failed outpatient treatment with doxycycline - Negative for DVT and negative for bony abnormalities per x-ray - CRP 11.9, ESR greater than 150 - CT does not show an abscess-  dc'd Vanc and change Cefepime to Ceftriaxone on 6/12 - blood cx are negative so far - have asked patient and RN to elevate the leg above the abdomen - on Tramadol, Fentanyl IV and Hydrocodone PRN as ordered by the admitting doctor  Active Problems:  AKI -  Creatinine in 2021 was 0.7 - Presented with a creatinine of 1.01 which has improved to 0.60    Normocytic anemia with acute blood loss anemia -Has had rectal bleeding in the hospital-last episode was this morning- dark blood noted the last hemoglobin we have prior to this was 14.6 in 2021 - -Hemoglobin was 7.8 when admitted- dropped to 6.2 - given 1 U PRBC- Hgb now 6.1-we will transfuse a second unit today -Anemia panel reviewed-iron is 21 and slightly low, iron saturation is 11 and normal, ferritin is 191 and normal, folate is 10.5 and normal, vitamin B12 is 561 and normal -Receiving Protonix twice daily IV - never had a colonoscopy- GI is consulting and plans to scope tomorrow    Thrombocytosis - Platelets are 378 today improved from 549 when she was admitted    Hypokalemia -Potassium improved from 2.8-3.2 today-continue to replace - Magnesium normal at 1.8     Essential hypertension -  follow and avoid treating while she is actively bleeding    Tobacco abuse  - Nicotine patch  - counseled    DVT prophylaxis:  SCDs Start: 12/20/21 2352   Code Status: Full Code  Consultants: none Level of Care: Level of care: Telemetry Disposition Plan:  Status is: Inpatient Remains inpatient appropriate because: cellulitis -failed outpt antibiotics and acute GI bleeding  Objective:   Vitals:  12/22/21 0610 12/22/21 1110 12/22/21 1138 12/22/21 1139  BP: (!) 145/77 137/65 131/60 131/60  Pulse: 100 91 91 91  Resp: _0 Temp: 98.5 F (36.9 C) 98.6 F (37 C) 99.2 F (37.3 C) 99.2 F (37.3 C)  TempSrc: Oral Oral Oral   SpO2: 100% 100% 100%    There were no vitals filed for this visit. Exam: General exam: Appears comfortable  HEENT: PERRLA, oral mucosa moist, no sclera icterus or thrush Respiratory system: Clear to auscultation. Respiratory effort normal. Cardiovascular system: S1 & S2 heard, regular rate and rhythm Gastrointestinal system: Abdomen soft, non-tender,  nondistended. Normal bowel sounds   Central nervous system: Alert and oriented. No focal neurological deficits. Extremities: No cyanosis, clubbing - severe edema of left leg with warmth and tenderness- slightly better than yesterday Skin: No rashes or ulcers Psychiatry:  Mood & affect appropriate.    Imaging and lab data was personally reviewed    CBC: Recent Labs  Lab 12/20/21 1517 12/20/21 2045 12/21/21 0555 12/21/21 1248 12/21/21 1406 12/21/21 2145 12/22/21 0522  WBC 12.0*   < > 8.5 8.9 8.4 9.0 8.9  NEUTROABS 9.5*  --   --   --   --   --   --   HGB 7.8*   < > 6.2* 6.6* 6.7* 6.6* 6.1*  HCT 24.6*   < > 19.7* 20.9* 22.9* 20.2* 19.1*  MCV 92.8   < > 95.2 94.1 103.6* 92.7 94.1  PLT 549*   < > 427* 427* 498* 474* 378   < > = values in this interval not displayed.    Basic Metabolic Panel: Recent Labs  Lab 12/20/21 1517 12/20/21 2027 12/20/21 2045 12/21/21 0555 12/21/21 1406 12/22/21 0859  NA 142  --  142 144 143 143  K 2.8*  --  2.8* 3.1* 3.1* 3.2*  CL 114*  --  114* 116* 116* 116*  CO2 20*  --  22 18* 19* 21*  GLUCOSE 112*  --  107* 99 136* 94  BUN 26*  --  26* 25* 20 16  CREATININE 1.01*  --  0.83 0.82 0.68 0.60  CALCIUM 9.1  --  8.7* 8.7* 8.8* 8.6*  MG  --  1.8  --   --   --   --   PHOS  --  3.9  --   --   --   --     GFR: Estimated Creatinine Clearance: 99.6 mL/min (by C-G formula based on SCr of 0.6 mg/dL).  Scheduled Meds:  nicotine  14 mg Transdermal Daily   pantoprazole (PROTONIX) IV  40 mg Intravenous Q12H   polyethylene glycol-electrolytes  2,000 mL Oral Once   Followed by   [START ON 12/23/2021] polyethylene glycol-electrolytes  2,000 mL Oral Once   Continuous Infusions:  cefTRIAXone (ROCEPHIN)  IV 2 g (12/21/21 1226)     LOS: 2 days   Author: Debbe Odea  12/22/2021 12:45 PM

## 2021-12-23 ENCOUNTER — Encounter (HOSPITAL_COMMUNITY)
Admission: EM | Disposition: A | Discharge status: Home / Self Care | Payer: Self-pay | Source: Home / Self Care | Attending: Internal Medicine

## 2021-12-23 ENCOUNTER — Inpatient Hospital Stay (HOSPITAL_COMMUNITY): Payer: BC Managed Care – PPO | Admitting: Anesthesiology

## 2021-12-23 ENCOUNTER — Encounter (HOSPITAL_COMMUNITY): Payer: Self-pay | Admitting: Internal Medicine

## 2021-12-23 DIAGNOSIS — I10 Essential (primary) hypertension: Secondary | ICD-10-CM | POA: Diagnosis not present

## 2021-12-23 DIAGNOSIS — N179 Acute kidney failure, unspecified: Secondary | ICD-10-CM | POA: Diagnosis not present

## 2021-12-23 DIAGNOSIS — E876 Hypokalemia: Secondary | ICD-10-CM | POA: Diagnosis not present

## 2021-12-23 DIAGNOSIS — L03115 Cellulitis of right lower limb: Secondary | ICD-10-CM | POA: Diagnosis not present

## 2021-12-23 HISTORY — PX: POLYPECTOMY: SHX5525

## 2021-12-23 HISTORY — PX: BIOPSY: SHX5522

## 2021-12-23 HISTORY — PX: COLONOSCOPY WITH PROPOFOL: SHX5780

## 2021-12-23 HISTORY — PX: ESOPHAGOGASTRODUODENOSCOPY (EGD) WITH PROPOFOL: SHX5813

## 2021-12-23 LAB — BASIC METABOLIC PANEL
Anion gap: 6 (ref 5–15)
BUN: 14 mg/dL (ref 6–20)
CO2: 22 mmol/L (ref 22–32)
Calcium: 9.1 mg/dL (ref 8.9–10.3)
Chloride: 116 mmol/L — ABNORMAL HIGH (ref 98–111)
Creatinine, Ser: 0.62 mg/dL (ref 0.44–1.00)
GFR, Estimated: >60 mL/min (ref >60)
Glucose, Bld: 91 mg/dL (ref 70–99)
Potassium: 3.5 mmol/L (ref 3.5–5.1)
Sodium: 144 mmol/L (ref 135–145)

## 2021-12-23 LAB — CBC
HCT: 22.9 % — ABNORMAL LOW (ref 36.0–46.0)
HCT: 22.9 % — ABNORMAL LOW (ref 36.0–46.0)
Hemoglobin: 7.5 g/dL — ABNORMAL LOW (ref 12.0–15.0)
Hemoglobin: 7.5 g/dL — ABNORMAL LOW (ref 12.0–15.0)
MCH: 30.5 pg (ref 26.0–34.0)
MCH: 30.2 pg (ref 26.0–34.0)
MCHC: 32.8 g/dL (ref 30.0–36.0)
MCHC: 32.8 g/dL (ref 30.0–36.0)
MCV: 93.1 fL (ref 80.0–100.0)
MCV: 92.3 fL (ref 80.0–100.0)
Platelets: 425 10*3/uL — ABNORMAL HIGH (ref 150–400)
Platelets: 409 10*3/uL — ABNORMAL HIGH (ref 150–400)
RBC: 2.46 MIL/uL — ABNORMAL LOW (ref 3.87–5.11)
RBC: 2.48 MIL/uL — ABNORMAL LOW (ref 3.87–5.11)
RDW: 14.4 % (ref 11.5–15.5)
RDW: 14.4 % (ref 11.5–15.5)
WBC: 7.9 10*3/uL (ref 4.0–10.5)
WBC: 9 10*3/uL (ref 4.0–10.5)
nRBC: 0 % (ref 0.0–0.2)
nRBC: 0 % (ref 0.0–0.2)

## 2021-12-23 LAB — HAPTOGLOBIN: Haptoglobin: 263 mg/dL (ref 33–346)

## 2021-12-23 SURGERY — ESOPHAGOGASTRODUODENOSCOPY (EGD) WITH PROPOFOL
Anesthesia: Monitor Anesthesia Care

## 2021-12-23 MED ORDER — PROPOFOL 10 MG/ML IV BOLUS
INTRAVENOUS | Status: DC | PRN
  Administered 2021-12-23 (×4): 20 mg via INTRAVENOUS
  Administered 2021-12-23: 50 mg via INTRAVENOUS
  Administered 2021-12-23: 20 mg via INTRAVENOUS

## 2021-12-23 MED ORDER — PROPOFOL 500 MG/50ML IV EMUL
INTRAVENOUS | Status: DC | PRN
  Administered 2021-12-23: 125 ug/kg/min via INTRAVENOUS

## 2021-12-23 MED ORDER — DEXMEDETOMIDINE (PRECEDEX) IN NS 20 MCG/5ML (4 MCG/ML) IV SYRINGE
PREFILLED_SYRINGE | INTRAVENOUS | Status: DC | PRN
  Administered 2021-12-23: 8 ug via INTRAVENOUS
  Administered 2021-12-23: 4 ug via INTRAVENOUS
  Administered 2021-12-23: 8 ug via INTRAVENOUS

## 2021-12-23 MED ORDER — PANTOPRAZOLE SODIUM 40 MG IV SOLR
40.0000 mg | INTRAVENOUS | Status: DC
  Administered 2021-12-24 – 2021-12-25 (×2): 40 mg via INTRAVENOUS
  Filled 2021-12-23 (×3): qty 10

## 2021-12-23 MED ORDER — LIDOCAINE 2% (20 MG/ML) 5 ML SYRINGE
INTRAMUSCULAR | Status: DC | PRN
  Administered 2021-12-23: 100 mg via INTRAVENOUS

## 2021-12-23 MED ORDER — LACTATED RINGERS IV SOLN
INTRAVENOUS | Status: AC | PRN
  Administered 2021-12-23: 1000 mL via INTRAVENOUS

## 2021-12-23 MED ORDER — DEXMEDETOMIDINE (PRECEDEX) IN NS 20 MCG/5ML (4 MCG/ML) IV SYRINGE
PREFILLED_SYRINGE | INTRAVENOUS | Status: AC
  Filled 2021-12-23: qty 5

## 2021-12-23 MED ORDER — PROPOFOL 1000 MG/100ML IV EMUL
INTRAVENOUS | Status: AC
  Filled 2021-12-23: qty 100

## 2021-12-23 SURGICAL SUPPLY — 25 items

## 2021-12-23 NOTE — Transfer of Care (Signed)
Immediate Anesthesia Transfer of Care Note  Patient: ALFA LEIBENSPERGER  Procedure(s) Performed: ESOPHAGOGASTRODUODENOSCOPY (EGD) WITH PROPOFOL COLONOSCOPY WITH PROPOFOL BIOPSY  Patient Location: Endoscopy Unit  Anesthesia Type:MAC  Level of Consciousness: awake, alert  and oriented  Airway & Oxygen Therapy: Patient Spontanous Breathing and Patient connected to face mask oxygen  Post-op Assessment: Report given to RN and Post -op Vital signs reviewed and stable  Post vital signs: Reviewed and stable  Last Vitals:  Vitals Value Taken Time  BP    Temp    Pulse 93 12/23/21 1222  Resp 18 12/23/21 1222  SpO2 100 % 12/23/21 1222  Vitals shown include unvalidated device data.  Last Pain:  Vitals:   12/23/21 1035  TempSrc: Temporal  PainSc: 5       Patients Stated Pain Goal: 3 (63/87/56 4332)  Complications: No notable events documented.

## 2021-12-23 NOTE — Progress Notes (Signed)
PROGRESS NOTE    Teresa Phillips  ACZ:[id number] DOB: 1965-07-23 DOA: 12/20/2021 PCP: Patient, No Pcp Per   Brief Narrative:  The patient is a 56 year old female with hypertension who  has insurance but does not see a PCP. She presented to the hospital with right leg swelling and pain. The patient states that 2 weeks ago the right leg became swollen and blistered.  She was given 10 days of doxycycline 1 at an urgent care and took the entire course but this did not help.  Blisters have been draining and the leg is still swollen and very painful.  She has been applying topical Neosporin.  She has been taking ibuprofen and Excedrin for pain.  She has been vomiting.  She also reported some blood in the urine that occurred the day before she came to the ED.   In the ED: Noted to have 3+ edema of the right leg.  Venous duplex was negative for DVT.  X-ray of the foot and leg revealed subcutaneous edema and soft tissue swelling.  She was started on cefepime and vancomycin. She was also noted to have a hemoglobin of 7.8 and a potassium of 2.8. Initially declined a blood transfusion but the following day when hemoglobin was 6.1, she agreed to transfusion.  GI was consulted because the patient was having darker red stool and recommended an EGD and colonoscopy but the day before yesterday she declined EGD and colonoscopy but she has agreed to them and underwent procedure today.  EGD showed that she had a widely patent Schatzki ring found at the GE junction and a 1 cm hiatal hernia with a few nonbleeding cratered and superficial gastric ulcers with a clean ulcer base mid gastric antrum and pylorus.  Biopsies were taken and testing was sent for H. pylori.  Colonoscopy was done and showed diverticulosis in the sigmoid colon, descending colon, transverse colon and descending colon as well as a 4 mm polyp in the ascending colon and two 3 to 4 mm polyps in the rectum and the sigmoid colon that were removed.  GI has now  patient on regular diet and switched protonix to 40 mg once a day and recommending she needs to be on PPI once a day for 30 days.   Assessment and Plan:  Cellulitis of right leg - failed outpatient treatment with doxycycline - Negative for DVT and negative for bony abnormalities per x-ray - CRP 11.9, ESR greater than 150 - CT does not show an abscess-  dc'd Vanc and change Cefepime to Ceftriaxone on 6/12 and will continue  - blood cx are negative so far - have asked patient and RN to elevate the leg above the abdomen - On Tramadol, Fentanyl IV and Hydrocodone PRN as ordered by the admitting doctor -Will need PT/OT To evaluate and Treat   AKI, improved  -Creatinine in 2021 was 0.7 -Presented with a creatinine of 1.01 which has improved to 0.60 yesterday; now BUN/creatinine 14/0.62 -Avoid further nephrotoxic medications, contrast dyes, hypotension and dehydration and renally adjust medications -Repeat CMP in the a.m.   Normocytic anemia with acute blood loss anemia -Has had rectal bleeding in the hospital-last episode was this yesterday morning- dark blood noted -The last hemoglobin we have prior to this was 14.6 in 2021 -Hemoglobin was 7.8 when admitted- dropped to 6.2 - given 1 U PRBC- Hgb now 6.1 yesterday-so she was transfused a second unit on 12/22/2021 -Anemia panel reviewed-iron is 21 and slightly low, iron saturation is 11  and normal, ferritin is 191 and normal, folate is 10.5 and normal, vitamin B12 is 561 and normal -Receiving Protonix twice daily IV and now GI has stopped this and changed to p.o. Protonix -GI took the patient for an EGD and colonoscopy -EGD showed that she had a widely patent Schatzki ring found at the GE junction and a 1 cm hiatal hernia with a few nonbleeding cratered and superficial gastric ulcers with a clean ulcer base mid gastric antrum and pylorus.  Biopsies were taken and testing was sent for H. pylori.   -Colonoscopy was done and showed diverticulosis in  the sigmoid colon, descending colon, transverse colon and descending colon as well as a 4 mm polyp in the ascending colon and two 3 to 4 mm polyps in the rectum and the sigmoid colon that were removed.   Thrombocytosis -Platelets count has improved since admission when it was 549 and trended down to 378 now is trended slowly back up and is 425 -Continue monitor for signs and symptoms of bleeding; -Repeat CBC in a.m.  Hypokalemia -Potassium is now 3.5 and will need mag level to be checked in the morning -Replete with p.o. KCl 40 mg twice daily x2 doses -Continue to monitor and replete as necessary and repeat CMP in a.m.    Essential hypertension -Continuing to follow and avoid treating while she is actively bleeding -Continue to monitor blood pressures per protocol -Last blood pressure reading was 155/70   Tobacco Abuse -Nicotine patch -Counseled   Obesity -Complicates overall prognosis and care -Estimated body mass index is 36.64 kg/m as calculated from the following:   Height as of this encounter: 5' 8"  (1.727 m).   Weight as of this encounter: 109.3 kg.  -Weight Loss and Dietary Counseling given   DVT prophylaxis: SCDs Start: 12/20/21 2352    Code Status: Full Code Family Communication: No family currently at bedside  Disposition Plan:  Level of care: Progressive Status is: Inpatient Remains inpatient appropriate because: We will need to monitor for further bleeding and anticipating discharge in next 24 to 48 hours after she is transition from IV to p.o. antibiotics   Consultants:  Gastroenterology  Procedures:  EGD Findings:      The examined esophagus was normal.      A widely patent Schatzki ring was found at the gastroesophageal junction.      A 1 cm hiatal hernia was present.      Few non-bleeding cratered and superficial gastric ulcers with a clean       ulcer base (Forrest Class III) were found in the gastric body, in the       gastric antrum and at the  pylorus. The largest lesion was 6 mm in       largest dimension. Biopsies were taken with a cold forceps for       Helicobacter pylori testing.      The cardia and gastric fundus were normal on retroflexion.      Diffuse moderately erythematous mucosa without active bleeding and with       no stigmata of bleeding was found in the duodenal bulb.      The ampulla, first portion of the duodenum and second portion of the       duodenum were normal. Impression:               - Normal esophagus.                           -  Widely patent Schatzki ring.                           - 1 cm hiatal hernia.                           - Non-bleeding gastric ulcers with a clean ulcer                            base (Forrest Class III). Biopsied.                           - Erythematous duodenopathy.                           - Normal ampulla, first portion of the duodenum and                            second portion of the duodenum.  COLONOSCOPY Findings:      The perianal and digital rectal examinations were normal.      The terminal ileum appeared normal.      Scattered small and large-mouthed diverticula were found in the sigmoid       colon, descending colon, transverse colon and ascending colon.      A 4 mm polyp was found in the ascending colon. The polyp was sessile.       The polyp was removed with a cold biopsy forceps. Resection and       retrieval were complete.      Two sessile polyps were found in the rectum and sigmoid colon. The       polyps were 3 to 4 mm in size. These polyps were removed with a cold       biopsy forceps. Resection and retrieval were complete.      The exam was otherwise without abnormality on direct and retroflexion       views. Impression:               - The examined portion of the ileum was normal.                           - Diverticulosis in the sigmoid colon, in the                            descending colon, in the transverse colon and in                             the ascending colon.                           - One 4 mm polyp in the ascending colon, removed                            with a cold biopsy forceps. Resected and retrieved.                           - Two 3 to 4 mm polyps in the  rectum and in the                            sigmoid colon, removed with a cold biopsy forceps.                            Resected and retrieved.                           - The examination was otherwise normal on direct                            and retroflexion views. Moderate Sedation:      Patient did not receive moderate sedation for this procedure, but       instead received monitored anesthesia care. Recommendation:           - High fiber diet.                           - Continue present medications.                           - Await pathology results.                           - Repeat colonoscopy for surveillance based on                            pathology results.  Antimicrobials:  Anti-infectives (From admission, onward)    Start     Dose/Rate Route Frequency Ordered Stop   12/21/21 2200  vancomycin (VANCOREADY) IVPB 1500 mg/300 mL  Status:  Discontinued        1,500 mg 150 mL/hr over 120 Minutes Intravenous Every 24 hours 12/20/21 2120 12/21/21 0942   12/21/21 1200  cefTRIAXone (ROCEPHIN) 2 g in sodium chloride 0.9 % 100 mL IVPB        2 g 200 mL/hr over 30 Minutes Intravenous Every 24 hours 12/21/21 0942     12/21/21 0600  ceFEPIme (MAXIPIME) 2 g in sodium chloride 0.9 % 100 mL IVPB  Status:  Discontinued        2 g 200 mL/hr over 30 Minutes Intravenous Every 8 hours 12/20/21 2120 12/21/21 0942   12/20/21 2130  vancomycin (VANCOREADY) IVPB 2000 mg/400 mL        2,000 mg 200 mL/hr over 120 Minutes Intravenous  Once 12/20/21 2120 12/21/21 0106   12/20/21 2015  vancomycin (VANCOCIN) IVPB 1000 mg/200 mL premix  Status:  Discontinued        1,000 mg 200 mL/hr over 60 Minutes Intravenous  Once 12/20/21 2012 12/20/21 2120   12/20/21  2015  ceFEPIme (MAXIPIME) 2 g in sodium chloride 0.9 % 100 mL IVPB        2 g 200 mL/hr over 30 Minutes Intravenous  Once 12/20/21 2012 12/20/21 2122       Subjective: Seen and examined and she is on her way down to get her EGD and colonoscopy and states that she is doing okay.  Denies any nausea or vomiting.  Feels okay.  States that she came in for her leg.  No other concerns or complaints at  this time.  Objective: Vitals:   12/23/21 1222 12/23/21 1231 12/23/21 1242 12/23/21 1334  BP: (!) 145/66 (!) 145/55 (!) 149/55 (!) 155/76  Pulse: 89 90 91 90  Resp: 19 15 (!) 23 18  Temp: (!) 97.5 F (36.4 C)   98.7 F (37.1 C)  TempSrc: Temporal   Oral  SpO2:  100% 100% 100%  Weight:      Height:        Intake/Output Summary (Last 24 hours) at 12/23/2021 1510 Last data filed at 12/23/2021 1214 Gross per 24 hour  Intake 600 ml  Output --  Net 600 ml   Filed Weights   12/23/21 1035  Weight: 109.3 kg   Examination: Physical Exam:  Constitutional: WN/WD obese African-American female currently in no acute distress appears calm Respiratory: Diminished to auscultation bilaterally, no wheezing, rales, rhonchi or crackles. Normal respiratory effort and patient is not tachypenic. No accessory muscle use.  Unlabored breathing Cardiovascular: RRR, no murmurs / rubs / gallops. S1 and S2 auscultated.  Abdomen: Soft, non-tender, distended secondary body habitus. Bowel sounds positive.  GU: Deferred. Neurologic: CN 2-12 grossly intact with no focal deficits. Romberg sign and cerebellar reflexes not assessed.  Psychiatric: Normal judgment and insight. Alert and oriented x 3. Normal mood and appropriate affect.   Data Reviewed: I have personally reviewed following labs and imaging studies  CBC: Recent Labs  Lab 12/20/21 1517 12/20/21 2045 12/21/21 1406 12/21/21 2145 12/22/21 0522 12/22/21 1706 12/23/21 0520  WBC 12.0*   < > 8.4 9.0 8.9 9.4 7.9  NEUTROABS 9.5*  --   --   --   --   --    --   HGB 7.8*   < > 6.7* 6.6* 6.1* 7.7* 7.5*  HCT 24.6*   < > 22.9* 20.2* 19.1* 23.5* 22.9*  MCV 92.8   < > 103.6* 92.7 94.1 90.4 93.1  PLT 549*   < > 498* 474* 378 428* 425*   < > = values in this interval not displayed.   Basic Metabolic Panel: Recent Labs  Lab 12/20/21 2027 12/20/21 2045 12/21/21 0555 12/21/21 1406 12/22/21 0859 12/23/21 0520  NA  --  142 144 143 143 144  K  --  2.8* 3.1* 3.1* 3.2* 3.5  CL  --  114* 116* 116* 116* 116*  CO2  --  22 18* 19* 21* 22  GLUCOSE  --  107* 99 136* 94 91  BUN  --  26* 25* 20 16 14   CREATININE  --  0.83 0.82 0.68 0.60 0.62  CALCIUM  --  8.7* 8.7* 8.8* 8.6* 9.1  MG 1.8  --   --   --   --   --   PHOS 3.9  --   --   --   --   --    GFR: Estimated Creatinine Clearance: 99.3 mL/min (by C-G formula based on SCr of 0.62 mg/dL). Liver Function Tests: Recent Labs  Lab 12/20/21 1517 12/21/21 0555 12/22/21 0859  AST 13* 13* 11*  ALT 10 10 10   ALKPHOS 71 68 73  BILITOT 0.6 0.4 0.5  PROT 7.4 6.4* 6.4*  ALBUMIN 2.7* 2.4* 2.2*   No results for input(s): "LIPASE", "AMYLASE" in the last 168 hours. Recent Labs  Lab 12/20/21 2356  AMMONIA 19   Coagulation Profile: Recent Labs  Lab 12/20/21 2027  INR 1.2   Cardiac Enzymes: Recent Labs  Lab 12/20/21 1517  CKTOTAL 64   BNP (last 3 results) No results  for input(s): "PROBNP" in the last 8760 hours. HbA1C: No results for input(s): "HGBA1C" in the last 72 hours. CBG: No results for input(s): "GLUCAP" in the last 168 hours. Lipid Profile: No results for input(s): "CHOL", "HDL", "LDLCALC", "TRIG", "CHOLHDL", "LDLDIRECT" in the last 72 hours. Thyroid Function Tests: Recent Labs    12/20/21 2027  TSH 0.688   Anemia Panel: Recent Labs    12/20/21 2027 12/21/21 1248  VITAMINB12 561 422  FOLATE 10.5 12.4  FERRITIN 191 200  TIBC 193* 176*  IRON 21* 36  RETICCTPCT 1.6 1.9   Sepsis Labs: Recent Labs  Lab 12/20/21 1528 12/20/21 2027 12/20/21 2356 12/21/21 0555   PROCALCITON <0.10  --   --  <0.10  LATICACIDVEN  --  0.9 1.5  --     Recent Results (from the past 240 hour(s))  Culture, blood (routine x 2)     Status: None (Preliminary result)   Collection Time: 12/20/21  8:27 PM   Specimen: BLOOD  Result Value Ref Range Status   Specimen Description   Final    BLOOD LEFT ANTECUBITAL Performed at [facility], [place] [address] [place], [place] [address]    Special Requests   Final    BOTTLES DRAWN AEROBIC AND ANAEROBIC Blood Culture results may not be optimal due to an excessive volume of blood received in culture bottles Performed at [facility] [address], [place], [place] [address]    Culture   Final    NO GROWTH 2 DAYS Performed at [facility] [address], [place], [place] [address]    Report Status PENDING  Incomplete  Culture, blood (routine x 2)     Status: None (Preliminary result)   Collection Time: 12/20/21  8:41 PM   Specimen: BLOOD  Result Value Ref Range Status   Specimen Description   Final    BLOOD RIGHT ANTECUBITAL Performed at [facility] [address], [place], [place] [address]    Special Requests   Final    BOTTLES DRAWN AEROBIC AND ANAEROBIC Blood Culture adequate volume Performed at [facility] [address] [place], [place] [address]    Culture   Final    NO GROWTH 2 DAYS Performed at [facility] [address], [place], [place] [address]    Report Status PENDING  Incomplete  MRSA Next Gen by PCR, Nasal     Status: None   Collection Time: 12/20/21  9:46 PM   Specimen: Nasal Mucosa; Nasal Swab  Result Value Ref Range Status   MRSA by PCR Next Gen NOT DETECTED NOT DETECTED Final    Comment: (NOTE) The GeneXpert MRSA Assay (FDA approved for NASAL specimens only), is one component of a comprehensive MRSA colonization surveillance program. It is not intended to diagnose MRSA infection nor to guide or  monitor treatment for MRSA infections. Test performance is not FDA approved in patients less than 8 years old. Performed at [facility], [place] [address] [place], [place] [address]      Radiology Studies: No results found.  Scheduled Meds:  multivitamin with minerals  1 tablet Oral Daily   nicotine  14 mg Transdermal Daily   nutrition supplement (JUVEN)  1 packet Oral BID BM   [START ON 12/24/2021] pantoprazole (PROTONIX) IV  40 mg Intravenous Q24H   Continuous Infusions:  cefTRIAXone (ROCEPHIN)  IV 2 g (12/22/21 1739)    LOS: 3 days  Raiford Noble, DO [facility] Available via Epic secure chat 7am-7pm After these hours, please refer to coverage provider listed on [email or web address] 12/23/2021, 3:10 PM

## 2021-12-23 NOTE — Interval H&P Note (Signed)
History and Physical Interval Note: 56/female with significant NSAID use, maroon stools, anemia for EGD and colonoscopy with propofol.  12/23/2021 11:03 AM  Teresa Phillips  has presented today for EGD and colonoscopy, with the diagnosis of Anemia, blood in stool, NSAID use.  The various methods of treatment have been discussed with the patient and family. After consideration of risks, benefits and other options for treatment, the patient has consented to  Procedure(s): ESOPHAGOGASTRODUODENOSCOPY (EGD) WITH PROPOFOL (N/A) COLONOSCOPY WITH PROPOFOL (N/A) as a surgical intervention.  The patient's history has been reviewed, patient examined, no change in status, stable for surgery.  I have reviewed the patient's chart and labs.  Questions were answered to the patient's satisfaction.     Ronnette Juniper

## 2021-12-23 NOTE — Anesthesia Procedure Notes (Signed)
Procedure Name: MAC Date/Time: 12/23/2021 11:44 AM  Performed by: Sharlette Dense, CRNAPre-anesthesia Checklist: Patient identified, Emergency Drugs available, Suction available and Patient being monitored Patient Re-evaluated:Patient Re-evaluated prior to induction Oxygen Delivery Method: Simple face mask Preoxygenation: Pre-oxygenation with 100% oxygen Induction Type: IV induction Airway Equipment and Method: Bite block Placement Confirmation: positive ETCO2 and breath sounds checked- equal and bilateral Dental Injury: Teeth and Oropharynx as per pre-operative assessment

## 2021-12-23 NOTE — TOC Initial Note (Signed)
Transition of Care [facility]) - Initial/Assessment Note    Patient Details  Name: Teresa Phillips MRN: [id number] Date of Birth: 05-09-1966  Transition of Care [facility]) CM/SW Contact:    Leeroy Cha, RN Phone Number: 12/23/2021, 7:21 AM  Clinical Narrative:                  Transition of Care [facility]) Screening Note   Patient Details  Name: Teresa Phillips Date of Birth: 1966/02/29   Transition of Care [facility]) CM/SW Contact:    Leeroy Cha, RN Phone Number: 12/23/2021, 7:21 AM    Transition of Care Department [facility]) has reviewed patient and no TOC needs have been identified at this time. We will continue to monitor patient advancement through interdisciplinary progression rounds. If new patient transition needs arise, please place a TOC consult.    Expected Discharge Plan: Home/Self Care Barriers to Discharge: Continued Medical Work up   Patient Goals and CMS Choice Patient states their goals for this hospitalization and ongoing recovery are:: to return home CMS Medicare.gov Compare Post Acute Care list provided to:: Patient    Expected Discharge Plan and Services Expected Discharge Plan: Home/Self Care   Discharge Planning Services: CM Consult   Living arrangements for the past 2 months: Single Family Home                                      Prior Living Arrangements/Services Living arrangements for the past 2 months: Single Family Home Lives with:: Self Patient language and need for interpreter reviewed:: Yes Do you feel safe going back to the place where you live?: Yes            Criminal Activity/Legal Involvement Pertinent to Current Situation/Hospitalization: No - Comment as needed  Activities of Daily Living Home Assistive Devices/Equipment: None ADL Screening (condition at time of admission) Patient's cognitive ability adequate to safely complete daily activities?: Yes Is the patient deaf or have difficulty hearing?: No Does the patient have  difficulty seeing, even when wearing glasses/contacts?: No Does the patient have difficulty concentrating, remembering, or making decisions?: No Patient able to express need for assistance with ADLs?: Yes Does the patient have difficulty dressing or bathing?: No Independently performs ADLs?: Yes (appropriate for developmental age) Does the patient have difficulty walking or climbing stairs?: No Weakness of Legs: Right Weakness of Arms/Hands: None  Permission Sought/Granted                  Emotional Assessment Appearance:: Appears stated age     Orientation: : Oriented to Self, Oriented to Place, Oriented to  Time, Oriented to Situation Alcohol / Substance Use: Not Applicable Psych Involvement: No (comment)  Admission diagnosis:  Hypokalemia [E87.6] Leg edema [R60.0] Thrombocytosis [D75.839] Cellulitis of right leg [L03.115] Cellulitis of right lower extremity [L03.115] Anemia, unspecified type [D64.9] Patient Active Problem List   Diagnosis Date Noted   Anemia    Cellulitis of right lower extremity    Cellulitis of right leg 12/20/2021   Normocytic anemia 12/20/2021   Hypokalemia 12/20/2021   Essential hypertension 12/20/2021   Tobacco abuse 12/20/2021   Upper GI bleed 12/20/2021   AKI (acute kidney injury) ([place]) 12/20/2021   PCP:  Patient, No Pcp Per (Inactive) Pharmacy:   [id number] [address], [place] [address] Phone: [phone] Fax: [phone]  [id number] -  [place], [place] [id number] [place] [id number] Phone: [phone] Fax: [phone]     Social Determinants of Health (SDOH) Interventions    Readmission Risk Interventions     No data to display

## 2021-12-23 NOTE — Op Note (Signed)
[facility] Patient Name: Teresa Phillips Procedure Date: 12/23/2021 MRN: [id number] Attending MD: Kerin Salen , MD Date of Birth: [birth year] CSN: [id number] Age: 56 Admit Type: Inpatient Procedure:                Colonoscopy Indications:              This is the patient's first colonoscopy, Rectal                            bleeding, Acute post hemorrhagic anemia Providers:                Kerin Salen, MD, Benjaman Lobe, RN, [facility]                            Technician, Technician Referring MD:             [facility] Medicines:                Propofol per Anesthesia Complications:            No immediate complications. Estimated blood loss:                            Minimal. Estimated Blood Loss:     Estimated blood loss was minimal. Procedure:                Pre-Anesthesia Assessment:                           - Prior to the procedure, a History and Physical                            was performed, and patient medications and                            allergies were reviewed. The patient's tolerance of                            previous anesthesia was also reviewed. The risks                            and benefits of the procedure and the sedation                            options and risks were discussed with the patient.                            All questions were answered, and informed consent                            was obtained. Prior Anticoagulants: The patient has                            taken no previous anticoagulant or antiplatelet                            agents. ASA  Grade Assessment: III - A patient with                            severe systemic disease. After reviewing the risks                            and benefits, the patient was deemed in                            satisfactory condition to undergo the procedure.                           - Prior to the procedure, a History and Physical                            was performed,  and patient medications and                            allergies were reviewed. The patient's tolerance of                            previous anesthesia was also reviewed. The risks                            and benefits of the procedure and the sedation                            options and risks were discussed with the patient.                            All questions were answered, and informed consent                            was obtained. Prior Anticoagulants: The patient has                            taken no previous anticoagulant or antiplatelet                            agents. ASA Grade Assessment: III - A patient with                            severe systemic disease. After reviewing the risks                            and benefits, the patient was deemed in                            satisfactory condition to undergo the procedure.                           After obtaining informed consent, the colonoscope  was passed under direct vision. Throughout the                            procedure, the patient's blood pressure, pulse, and                            oxygen saturations were monitored continuously. The                            [id number]-HQ190L ([id number] Olympus colonoscope was                            introduced through the anus and advanced to the the                            terminal ileum. The colonoscopy was performed                            without difficulty. The patient tolerated the                            procedure well. The quality of the bowel                            preparation was good. Scope In: 12:01:03 PM Scope Out: 12:13:06 PM Scope Withdrawal Time: 0 hours 8 minutes 56 seconds  Total Procedure Duration: 0 hours 12 minutes 3 seconds  Findings:      The perianal and digital rectal examinations were normal.      The terminal ileum appeared normal.      Scattered small and large-mouthed diverticula were found in  the sigmoid       colon, descending colon, transverse colon and ascending colon.      A 4 mm polyp was found in the ascending colon. The polyp was sessile.       The polyp was removed with a cold biopsy forceps. Resection and       retrieval were complete.      Two sessile polyps were found in the rectum and sigmoid colon. The       polyps were 3 to 4 mm in size. These polyps were removed with a cold       biopsy forceps. Resection and retrieval were complete.      The exam was otherwise without abnormality on direct and retroflexion       views. Impression:               - The examined portion of the ileum was normal.                           - Diverticulosis in the sigmoid colon, in the                            descending colon, in the transverse colon and in                            the ascending colon.                           -  One 4 mm polyp in the ascending colon, removed                            with a cold biopsy forceps. Resected and retrieved.                           - Two 3 to 4 mm polyps in the rectum and in the                            sigmoid colon, removed with a cold biopsy forceps.                            Resected and retrieved.                           - The examination was otherwise normal on direct                            and retroflexion views. Moderate Sedation:      Patient did not receive moderate sedation for this procedure, but       instead received monitored anesthesia care. Recommendation:           - High fiber diet.                           - Continue present medications.                           - Await pathology results.                           - Repeat colonoscopy for surveillance based on                            pathology results. Procedure Code(s):        --- Professional ---                           [phone], Colonoscopy, flexible; with biopsy, single                            or multiple Diagnosis Code(s):        ---  Professional ---                           K63.5, Polyp of colon                           K62.1, Rectal polyp                           K62.5, Hemorrhage of anus and rectum                           D62, Acute posthemorrhagic anemia  K57.30, Diverticulosis of large intestine without                            perforation or abscess without bleeding CPT copyright 2019 [employer]. All rights reserved. The codes documented in this report are preliminary and upon coder review may  be revised to meet current compliance requirements. Kerin Salen, MD 12/23/2021 12:21:47 PM This report has been signed electronically. Number of Addenda: 0

## 2021-12-23 NOTE — Anesthesia Postprocedure Evaluation (Signed)
Anesthesia Post Note  Patient: Teresa Phillips  Procedure(s) Performed: ESOPHAGOGASTRODUODENOSCOPY (EGD) WITH PROPOFOL COLONOSCOPY WITH PROPOFOL BIOPSY     Patient location during evaluation: Endoscopy Anesthesia Type: MAC Level of consciousness: awake and alert Pain management: pain level controlled Vital Signs Assessment: post-procedure vital signs reviewed and stable Respiratory status: spontaneous breathing, nonlabored ventilation, respiratory function stable and patient connected to nasal cannula oxygen Cardiovascular status: blood pressure returned to baseline and stable Postop Assessment: no apparent nausea or vomiting Anesthetic complications: no   No notable events documented.  Last Vitals:  Vitals:   12/23/21 1242 12/23/21 1334  BP: (!) 149/55 (!) 155/76  Pulse: 91 90  Resp: (!) 23 18  Temp:  37.1 C  SpO2: 100% 100%    Last Pain:  Vitals:   12/23/21 1400  TempSrc:   PainSc: [place]

## 2021-12-23 NOTE — Op Note (Signed)
[facility] Patient Name: Teresa Phillips Procedure Date: 12/23/2021 MRN: [id number] Attending MD: Kerin Salen , MD Date of Birth: 1966/07/03 CSN: [id number] Age: 56 Admit Type: Outpatient Procedure:                Upper GI endoscopy Indications:              Epigastric abdominal pain, Melena, significant                            NSAID use Providers:                Kerin Salen, MD, Benjaman Lobe, RN, Rip Harbour, RN,                            Alan Ripper Technician, Technician Referring MD:             [facility] Medicines:                Monitored Anesthesia Care Complications:            No immediate complications. Estimated blood loss:                            Minimal. Estimated Blood Loss:     Estimated blood loss was minimal. Procedure:                Pre-Anesthesia Assessment:                           - Prior to the procedure, a History and Physical                            was performed, and patient medications and                            allergies were reviewed. The patient's tolerance of                            previous anesthesia was also reviewed. The risks                            and benefits of the procedure and the sedation                            options and risks were discussed with the patient.                            All questions were answered, and informed consent                            was obtained. Prior Anticoagulants: The patient has                            taken no previous anticoagulant or antiplatelet                            agents. ASA  Grade Assessment: III - A patient with                            severe systemic disease. After reviewing the risks                            and benefits, the patient was deemed in                            satisfactory condition to undergo the procedure.                           After obtaining informed consent, the endoscope was                            passed under  direct vision. Throughout the                            procedure, the patient's blood pressure, pulse, and                            oxygen saturations were monitored continuously. The                            [id number]-[id number] ([id number]) Olympus endoscope was introduced                            through the mouth, and advanced to the second part                            of duodenum. The upper GI endoscopy was                            accomplished without difficulty. The patient                            tolerated the procedure well. Scope In: Scope Out: Findings:      The examined esophagus was normal.      A widely patent Schatzki ring was found at the gastroesophageal junction.      A 1 cm hiatal hernia was present.      Few non-bleeding cratered and superficial gastric ulcers with a clean       ulcer base (Forrest Class III) were found in the gastric body, in the       gastric antrum and at the pylorus. The largest lesion was 6 mm in       largest dimension. Biopsies were taken with a cold forceps for       Helicobacter pylori testing.      The cardia and gastric fundus were normal on retroflexion.      Diffuse moderately erythematous mucosa without active bleeding and with       no stigmata of bleeding was found in the duodenal bulb.      The ampulla, first portion of the duodenum and second portion of the       duodenum were normal. Impression:               -  Normal esophagus.                           - Widely patent Schatzki ring.                           - 1 cm hiatal hernia.                           - Non-bleeding gastric ulcers with a clean ulcer                            base (Forrest Class III). Biopsied.                           - Erythematous duodenopathy.                           - Normal ampulla, first portion of the duodenum and                            second portion of the duodenum. Moderate Sedation:      Patient did not receive moderate sedation for  this procedure, but       instead received monitored anesthesia care. Recommendation:           - Resume regular diet.                           - Continue present medications. PPI once a day for                            1 month.                           - Await pathology results.                           - Avoid NSAIDs and ASA. Procedure Code(s):        --- Professional ---                           [phone], Esophagogastroduodenoscopy, flexible,                            transoral; with biopsy, single or multiple Diagnosis Code(s):        --- Professional ---                           K22.2, Esophageal obstruction                           K44.9, Diaphragmatic hernia without obstruction or                            gangrene                           K25.9, Gastric ulcer, unspecified  as acute or                            chronic, without hemorrhage or perforation                           K31.89, Other diseases of stomach and duodenum                           R10.13, Epigastric pain                           K92.1, Melena (includes Hematochezia) CPT copyright 2019 American Medical Association. All rights reserved. The codes documented in this report are preliminary and upon coder review may  be revised to meet current compliance requirements. Kerin Salen, MD 12/23/2021 12:18:46 PM This report has been signed electronically. Number of Addenda: 0

## 2021-12-24 ENCOUNTER — Inpatient Hospital Stay (HOSPITAL_COMMUNITY): Payer: BC Managed Care – PPO

## 2021-12-24 DIAGNOSIS — K625 Hemorrhage of anus and rectum: Secondary | ICD-10-CM

## 2021-12-24 DIAGNOSIS — E876 Hypokalemia: Secondary | ICD-10-CM | POA: Diagnosis not present

## 2021-12-24 DIAGNOSIS — N179 Acute kidney failure, unspecified: Secondary | ICD-10-CM | POA: Diagnosis not present

## 2021-12-24 DIAGNOSIS — L03115 Cellulitis of right lower limb: Secondary | ICD-10-CM | POA: Diagnosis not present

## 2021-12-24 DIAGNOSIS — I10 Essential (primary) hypertension: Secondary | ICD-10-CM | POA: Diagnosis not present

## 2021-12-24 LAB — COMPREHENSIVE METABOLIC PANEL
ALT: 12 U/L (ref 0–44)
AST: 16 U/L (ref 15–41)
Albumin: 2.5 g/dL — ABNORMAL LOW (ref 3.5–5.0)
Alkaline Phosphatase: 79 U/L (ref 38–126)
Anion gap: 5 (ref 5–15)
BUN: 11 mg/dL (ref 6–20)
CO2: 22 mmol/L (ref 22–32)
Calcium: 8.7 mg/dL — ABNORMAL LOW (ref 8.9–10.3)
Chloride: 115 mmol/L — ABNORMAL HIGH (ref 98–111)
Creatinine, Ser: 0.48 mg/dL (ref 0.44–1.00)
GFR, Estimated: >60 mL/min (ref >60)
Glucose, Bld: 99 mg/dL (ref 70–99)
Potassium: 3.5 mmol/L (ref 3.5–5.1)
Sodium: 142 mmol/L (ref 135–145)
Total Bilirubin: 0.4 mg/dL (ref 0.3–1.2)
Total Protein: 6.9 g/dL (ref 6.5–8.1)

## 2021-12-24 LAB — CBC WITH DIFFERENTIAL/PLATELET
Abs Immature Granulocytes: 0.03 10*3/uL (ref 0.00–0.07)
Basophils Absolute: 0 10*3/uL (ref 0.0–0.1)
Basophils Relative: 1 %
Eosinophils Absolute: 0.3 10*3/uL (ref 0.0–0.5)
Eosinophils Relative: 4 %
HCT: 22 % — ABNORMAL LOW (ref 36.0–46.0)
Hemoglobin: 7.1 g/dL — ABNORMAL LOW (ref 12.0–15.0)
Immature Granulocytes: 0 %
Lymphocytes Relative: 22 %
Lymphs Abs: 1.5 10*3/uL (ref 0.7–4.0)
MCH: 30.2 pg (ref 26.0–34.0)
MCHC: 32.3 g/dL (ref 30.0–36.0)
MCV: 93.6 fL (ref 80.0–100.0)
Monocytes Absolute: 0.7 10*3/uL (ref 0.1–1.0)
Monocytes Relative: 10 %
Neutro Abs: 4.3 10*3/uL (ref 1.7–7.7)
Neutrophils Relative %: 63 %
Platelets: 403 10*3/uL — ABNORMAL HIGH (ref 150–400)
RBC: 2.35 MIL/uL — ABNORMAL LOW (ref 3.87–5.11)
RDW: 14.5 % (ref 11.5–15.5)
WBC: 6.8 10*3/uL (ref 4.0–10.5)
nRBC: 0 % (ref 0.0–0.2)

## 2021-12-24 LAB — CBC
HCT: 20.5 % — ABNORMAL LOW (ref 36.0–46.0)
Hemoglobin: 6.9 g/dL — CL (ref 12.0–15.0)
MCH: 31.7 pg (ref 26.0–34.0)
MCHC: 33.7 g/dL (ref 30.0–36.0)
MCV: 94 fL (ref 80.0–100.0)
Platelets: 343 10*3/uL (ref 150–400)
RBC: 2.18 MIL/uL — ABNORMAL LOW (ref 3.87–5.11)
RDW: 14.4 % (ref 11.5–15.5)
WBC: 7.9 10*3/uL (ref 4.0–10.5)
nRBC: 0.3 % — ABNORMAL HIGH (ref 0.0–0.2)

## 2021-12-24 LAB — PREPARE RBC (CROSSMATCH)

## 2021-12-24 LAB — PHOSPHORUS: Phosphorus: 3 mg/dL (ref 2.5–4.6)

## 2021-12-24 LAB — MAGNESIUM: Magnesium: 1.9 mg/dL (ref 1.7–2.4)

## 2021-12-24 MED ORDER — TECHNETIUM TC 99M-LABELED RED BLOOD CELLS IV KIT: 25.0000 | PACK | Freq: Once | INTRAVENOUS | Status: DC

## 2021-12-24 MED ORDER — SODIUM CHLORIDE 0.9% IV SOLUTION: Freq: Once | INTRAVENOUS | Status: AC

## 2021-12-24 MED ORDER — POTASSIUM CHLORIDE CRYS ER 20 MEQ PO TBCR
40.0000 meq | EXTENDED_RELEASE_TABLET | Freq: Two times a day (BID) | ORAL | Status: AC
  Administered 2021-12-24 – 2021-12-25 (×2): 40 meq via ORAL
  Filled 2021-12-24 (×2): qty 2

## 2021-12-24 NOTE — Progress Notes (Signed)
[facility] Progress Note  Teresa Phillips 56 y.o. Mar 19, 1966  CC:  GI bleed   Subjective: Patient seen and examined laying in bed.  Patient reports this morning she began to have a bowel movement that contained a large amounts of bright red blood.  Patient reports 3 bowel movements today all containing blood.  Most recent bowel movement containing dark red clots.  Patient is feeling slightly fatigued and anxious.  Denies nausea, vomiting and abdominal pain.   S/p EGD and colonoscopy 12/23/2021  ROS : Review of Systems  Gastrointestinal:  Positive for blood in stool. Negative for abdominal pain, constipation, diarrhea, heartburn, melena, nausea and vomiting.  Genitourinary:  Negative for dysuria and urgency.      Objective: Vital signs in last 24 hours: Vitals:   12/24/21 1252 12/24/21 1315  BP: 134/79 (!) 130/59  Pulse: 93 90  Resp: 18 18  Temp: 98.5 F (36.9 C) 98.4 F (36.9 C)  SpO2: 100% 100%    Physical Exam:  General:  Alert, cooperative, no distress, appears stated age  Head:  Normocephalic, without obvious abnormality, atraumatic  Eyes:  Anicteric sclera, EOM's intact  Lungs:   Clear to auscultation bilaterally, respirations unlabored  Heart:  Regular rate and rhythm, S1, S2 normal  Abdomen:   Soft, non-tender, bowel sounds active all four quadrants,  no masses,   Extremities: Extremities normal, atraumatic, no  edema  Pulses: 2+ and symmetric    Lab Results: Recent Labs    12/23/21 0520 12/24/21 0506  NA 144 142  K 3.5 3.5  CL 116* 115*  CO2 22 22  GLUCOSE 91 99  BUN 14 11  CREATININE 0.62 0.48  CALCIUM 9.1 8.7*  MG  --  1.9  PHOS  --  3.0   Recent Labs    12/22/21 0859 12/24/21 0506  AST 11* 16  ALT 10 12  ALKPHOS 73 79  BILITOT 0.5 0.4  PROT 6.4* 6.9  ALBUMIN 2.2* 2.5*   Recent Labs    12/23/21 1607 12/24/21 0506  WBC 9.0 6.8  NEUTROABS  --  4.3  HGB 7.5* 7.1*  HCT 22.9* 22.0*  MCV 92.3 93.6  PLT 409* 403*   No results  for input(s): "LABPROT", "INR" in the last 72 hours.    Assessment Hematochezia Anemia  EGD 12/23/2021 - Widely patent Schatzki ring. - 1 cm hiatal hernia. - Non-bleeding gastric ulcers with a clean ulcer base (Forrest Class III). Biopsied. - Erythematous duodenopathy.  Colonoscopy 12/23/2021 - Diverticulosis in the sigmoid colon, in the descending colon, in the transverse colon and in the ascending colon. - One 4 mm polyp in the ascending colon, removed with a cold biopsy forceps. Resected and retrieved. - Two 3 to 4 mm polyps in the rectum and in the sigmoid colon, removed with a cold biopsy forceps. Resected and retrieved.  Hemoglobin 7.1 this morning prior to hematochezia episodes.   Patient with recent colonoscopy and EGD yesterday.  Patient had 3 polyps removed during colonoscopy yesterday.  Patient also had notable diverticulosis throughout colon.  Patient began having moderate to large volume rectal bleeding this morning.  Notes blood ranged from bright red to dark red with clots.  Possible diverticular bleeding.  Plan: Ordered nuclear medicine GI bleeding scan, pending results Agree with 1 unit packed red blood cell ordered Continue to monitor hemoglobin transfuse if less than 7 Continue pantoprazole 40 mg IV daily [facility] will follow.  Roseanne Reno Verl Kitson PA-C 12/24/2021, 1:46 PM  Contact #  [phone]  

## 2021-12-24 NOTE — Progress Notes (Signed)
Pt up to the bathroom and Pt called out for RN> Pt reports "I was trying to have a bm and there is blood in the toilet" Rn assessed bright red blood in the toilet

## 2021-12-24 NOTE — Progress Notes (Signed)
PROGRESS NOTE    Teresa Phillips  KDT:[id number] DOB: 06/19/1966 DOA: 12/20/2021 PCP: Patient, No Pcp Per   Brief Narrative:  The patient is a 56 year old female with hypertension who  has insurance but does not see a PCP. She presented to the hospital with right leg swelling and pain. The patient states that 2 weeks ago the right leg became swollen and blistered.  She was given 10 days of doxycycline 1 at an urgent care and took the entire course but this did not help.  Blisters have been draining and the leg is still swollen and very painful.  She has been applying topical Neosporin.  She has been taking ibuprofen and Excedrin for pain.  She has been vomiting.  She also reported some blood in the urine that occurred the day before she came to the ED.   In the ED: Noted to have 3+ edema of the right leg.  Venous duplex was negative for DVT.  X-ray of the foot and leg revealed subcutaneous edema and soft tissue swelling.  She was started on cefepime and vancomycin. She was also noted to have a hemoglobin of 7.8 and a potassium of 2.8. Initially declined a blood transfusion but the following day when hemoglobin was 6.1, she agreed to transfusion.  GI was consulted because the patient was having darker red stool and recommended an EGD and colonoscopy but the day before yesterday she declined EGD and colonoscopy but she has agreed to them and underwent procedure today.  EGD showed that she had a widely patent Schatzki ring found at the GE junction and a 1 cm hiatal hernia with a few nonbleeding cratered and superficial gastric ulcers with a clean ulcer base mid gastric antrum and pylorus.  Biopsies were taken and testing was sent for H. pylori.  Colonoscopy was done and showed diverticulosis in the sigmoid colon, descending colon, transverse colon and descending colon as well as a 4 mm polyp in the ascending colon and two 3 to 4 mm polyps in the rectum and the sigmoid colon that were removed.   GI has now  patient on regular diet and switched protonix to 40 mg once a day and recommending she needs to be on PPI once a day for 30 days.   Unfortunately the patient had some bright red blood per her rectum this morning and had multiple bloody bowel movements with clots.  GI was reconsulted and blood count had dropped to 7.1 so she was typed and screened and transfused another 1 unit PRBCs.  GI recommending a nuclear medicine GI blood loss scan and they feel that that both of her rectal bleeding are related to acute diverticular bleeding.  They feel that that the nuclear medicine GI blood loss scan is positive they recommend IR evaluation for CT angiogram and embolization.   Assessment and Plan:  Cellulitis of right leg, improving slowly  - failed outpatient treatment with doxycycline - Negative for DVT and negative for bony abnormalities per x-ray - CRP 11.9, ESR greater than 150 - CT does not show an abscess-  dc'd Vanc and change Cefepime to Ceftriaxone on 6/12 and will continue  - blood cx are negative so far - have asked patient and RN to elevate the leg above the abdomen - On Tramadol, Fentanyl IV and Hydrocodone PRN as ordered by the admitting doctor -Will need PT/OT To evaluate and Treat   AKI, improved  -Creatinine in 2021 was 0.7 -Presented with a creatinine of 1.01 which  has improved and now BUN/Cr is 11/0.48 -Avoid further nephrotoxic medications, contrast dyes, hypotension and dehydration and renally adjust medications -Repeat CMP in the a.m.   Normocytic Anemia with Acute Blood Loss Anemia -Has had rectal bleeding in the hospital-last episode was this yesterday morning- dark blood noted -The last hemoglobin we have prior to this was 14.6 in 2021 -s/p 2 units of pRBCs and will get a 3rd unit today  -Anemia panel reviewed-iron is 21 and slightly low, iron saturation is 11 and normal, ferritin is 191 and normal, folate is 10.5 and normal, vitamin B12 is 561 and normal -Receiving  Protonix twice daily IV and now GI has stopped this and changed to p.o. Protonix -GI took the patient for an EGD and colonoscopy -EGD showed that she had a widely patent Schatzki ring found at the GE junction and a 1 cm hiatal hernia with a few nonbleeding cratered and superficial gastric ulcers with a clean ulcer base mid gastric antrum and pylorus.  Biopsies were taken and testing was sent for H. pylori.   -Colonoscopy was done and showed diverticulosis in the sigmoid colon, descending colon, transverse colon and descending colon as well as a 4 mm polyp in the ascending colon and two 3 to 4 mm polyps in the rectum and the sigmoid colon that were removed. -Hemoglobin/hematocrit went from 7.5/22.9 and trended down to 7.1/22.0 but she continues to have some GI bleeding and had 3 episodes of bright red blood per rectum today with clots -GI was reconsulted and recommending nuclear medicine GI bleeding scan and if it is positive consulting IR for CTA and embolization   Thrombocytosis -Platelets count is now gone from 428 -> 425 -> 409 -> 403 -Continue monitor for signs and symptoms of bleeding; -Repeat CBC in a.m.   Hypokalemia -Potassium is now 3.5 x2  -Replete with p.o. KCl 40 mg twice daily x2 doses again -Continue to monitor and replete as necessary and repeat CMP in a.m.    Essential Hypertension -Continuing to follow and avoid treating while she is actively bleeding -Continue to monitor blood pressures per protocol -Last blood pressure reading was 130/59   Tobacco Abuse -Nicotine patch 14 mg TD Daily  -Smoking Cessation Counseling given   Hypoalbuminemia -Patient's albumin level went from 2.2 is now 2.5 -Continue monitor and trend and repeat CMP in a.m.   Obesity -Complicates overall prognosis and care -Estimated body mass index is 36.64 kg/m as calculated from the following:   Height as of this encounter: _0  (1.727 m).   Weight as of this encounter: 109.3 kg.  -Weight Loss  and Dietary Counseling given   DVT prophylaxis: SCDs Start: 12/20/21 2352    Code Status: Full Code Family Communication: No family currently at bedside  Disposition Plan:  Level of care: Progressive Status is: Inpatient Remains inpatient appropriate because: Is having recurrent GI bleeding and no bright red blood per rectum.  The GI is recommending a nuclear medicine GI bleeding loss scan and consulting IR if it is positive   Consultants:  Gastroenterology  Procedures:  EGD Findings:      The examined esophagus was normal.      A widely patent Schatzki ring was found at the gastroesophageal junction.      A 1 cm hiatal hernia was present.      Few non-bleeding cratered and superficial gastric ulcers with a clean       ulcer base (Forrest Class III) were found in the gastric body,  in the       gastric antrum and at the pylorus. The largest lesion was 6 mm in       largest dimension. Biopsies were taken with a cold forceps for       Helicobacter pylori testing.      The cardia and gastric fundus were normal on retroflexion.      Diffuse moderately erythematous mucosa without active bleeding and with       no stigmata of bleeding was found in the duodenal bulb.      The ampulla, first portion of the duodenum and second portion of the       duodenum were normal. Impression:               - Normal esophagus.                           - Widely patent Schatzki ring.                           - 1 cm hiatal hernia.                           - Non-bleeding gastric ulcers with a clean ulcer                            base (Forrest Class III). Biopsied.                           - Erythematous duodenopathy.                           - Normal ampulla, first portion of the duodenum and                            second portion of the duodenum.   COLONOSCOPY Findings:      The perianal and digital rectal examinations were normal.      The terminal ileum appeared normal.      Scattered  small and large-mouthed diverticula were found in the sigmoid       colon, descending colon, transverse colon and ascending colon.      A 4 mm polyp was found in the ascending colon. The polyp was sessile.       The polyp was removed with a cold biopsy forceps. Resection and       retrieval were complete.      Two sessile polyps were found in the rectum and sigmoid colon. The       polyps were 3 to 4 mm in size. These polyps were removed with a cold       biopsy forceps. Resection and retrieval were complete.      The exam was otherwise without abnormality on direct and retroflexion       views. Impression:               - The examined portion of the ileum was normal.                           - Diverticulosis in the sigmoid colon, in the  descending colon, in the transverse colon and in                            the ascending colon.                           - One 4 mm polyp in the ascending colon, removed                            with a cold biopsy forceps. Resected and retrieved.                           - Two 3 to 4 mm polyps in the rectum and in the                            sigmoid colon, removed with a cold biopsy forceps.                            Resected and retrieved.                           - The examination was otherwise normal on direct                            and retroflexion views. Moderate Sedation:      Patient did not receive moderate sedation for this procedure, but       instead received monitored anesthesia care. Recommendation:           - High fiber diet.                           - Continue present medications.                           - Await pathology results.                           - Repeat colonoscopy for surveillance based on                            pathology results.    Antimicrobials:  Anti-infectives (From admission, onward)    Start     Dose/Rate Route Frequency Ordered Stop   12/21/21 2200  vancomycin  (VANCOREADY) IVPB 1500 mg/300 mL  Status:  Discontinued        1,500 mg 150 mL/hr over 120 Minutes Intravenous Every 24 hours 12/20/21 2120 12/21/21 0942   12/21/21 1200  cefTRIAXone (ROCEPHIN) 2 g in sodium chloride 0.9 % 100 mL IVPB        2 g 200 mL/hr over 30 Minutes Intravenous Every 24 hours 12/21/21 0942     12/21/21 0600  ceFEPIme (MAXIPIME) 2 g in sodium chloride 0.9 % 100 mL IVPB  Status:  Discontinued        2 g 200 mL/hr over 30 Minutes Intravenous Every 8 hours 12/20/21 2120 12/21/21 0942   12/20/21 2130  vancomycin (VANCOREADY) IVPB 2000 mg/400 mL  2,000 mg 200 mL/hr over 120 Minutes Intravenous  Once 12/20/21 2120 12/21/21 0106   12/20/21 2015  vancomycin (VANCOCIN) IVPB 1000 mg/200 mL premix  Status:  Discontinued        1,000 mg 200 mL/hr over 60 Minutes Intravenous  Once 12/20/21 2012 12/20/21 2120   12/20/21 2015  ceFEPIme (MAXIPIME) 2 g in sodium chloride 0.9 % 100 mL IVPB        2 g 200 mL/hr over 30 Minutes Intravenous  Once 12/20/21 2012 12/20/21 2122        Subjective: Seen and examined at bedside and was feeling weak and fatigued.  States that she had multiple bloody bowel movements this morning had 3 episodes so far to had all blood and had some clots.  No chest pain or shortness of breath.  No other concerns or complaints this time  Objective: Vitals:   12/23/21 2041 12/24/21 0635 12/24/21 1252 12/24/21 1315  BP: (!) 153/77 (!) 164/78 134/79 (!) 130/59  Pulse: 88 88 93 90  Resp: _0 Temp: 98.9 F (37.2 C) 99.2 F (37.3 C) 98.5 F (36.9 C) 98.4 F (36.9 C)  TempSrc: Oral Oral Oral Oral  SpO2: 100% 100% 100% 100%  Weight:      Height:       No intake or output data in the 24 hours ending 12/24/21 1642 Filed Weights   12/23/21 1035  Weight: 109.3 kg   Examination: Physical Exam:  Constitutional: WN/WD obese AAF in NAD appears fatigued sitting in the chair at bedside  Respiratory: Diminished to auscultation bilaterally with  coarse breath sounds, no wheezing, rales, rhonchi or crackles. Normal respiratory effort and patient is not tachypenic. No accessory muscle use. Unlabored breathing  Cardiovascular: RRR, no murmurs / rubs / gallops. S1 and S2 auscultated. Right Leg is warm, slightly erythematous and swollen  Abdomen: Soft, non-tender, Distended 2/2 body habitus. Bowel sounds positive.  GU: Deferred. Musculoskeletal: No clubbing / cyanosis of digits/nails. No joint deformity upper and lower extremities.  Skin: No rashes, lesions, ulcers on a limited skin evaluation. No induration; Warm and dry.  Neurologic: CN 2-12 grossly intact with no focal deficits. Romberg sign and cerebellar reflexes not assessed.  Psychiatric: Normal judgment and insight. Alert and oriented x 3. Normal mood and appropriate affect.   Data Reviewed: I have personally reviewed following labs and imaging studies  CBC: Recent Labs  Lab 12/20/21 1517 12/20/21 2045 12/22/21 0522 12/22/21 1706 12/23/21 0520 12/23/21 1607 12/24/21 0506  WBC 12.0*   < > 8.9 9.4 7.9 9.0 6.8  NEUTROABS 9.5*  --   --   --   --   --  4.3  HGB 7.8*   < > 6.1* 7.7* 7.5* 7.5* 7.1*  HCT 24.6*   < > 19.1* 23.5* 22.9* 22.9* 22.0*  MCV 92.8   < > 94.1 90.4 93.1 92.3 93.6  PLT 549*   < > 378 428* 425* 409* 403*   < > = values in this interval not displayed.   Basic Metabolic Panel: Recent Labs  Lab 12/20/21 2027 12/20/21 2045 12/21/21 0555 12/21/21 1406 12/22/21 0859 12/23/21 0520 12/24/21 0506  NA  --    < > 144 143 143 144 142  K  --    < > 3.1* 3.1* 3.2* 3.5 3.5  CL  --    < > 116* 116* 116* 116* 115*  CO2  --    < > 18* 19* 21* 22 22  GLUCOSE  --    < > 99 136* 94 91 99  BUN  --    < > 25* _0 CREATININE  --    < > 0.82 0.68 0.60 0.62 0.48  CALCIUM  --    < > 8.7* 8.8* 8.6* 9.1 8.7*  MG 1.8  --   --   --   --   --  1.9  PHOS 3.9  --   --   --   --   --  3.0   < > = values in this interval not displayed.   GFR: Estimated Creatinine  Clearance: 99.3 mL/min (by C-G formula based on SCr of 0.48 mg/dL). Liver Function Tests: Recent Labs  Lab 12/20/21 1517 12/21/21 0555 12/22/21 0859 12/24/21 0506  AST 13* 13* 11* 16  ALT _1 ALKPHOS 71 68 73 79  BILITOT 0.6 0.4 0.5 0.4  PROT 7.4 6.4* 6.4* 6.9  ALBUMIN 2.7* 2.4* 2.2* 2.5*   No results for input(s): "LIPASE", "AMYLASE" in the last 168 hours. Recent Labs  Lab 12/20/21 2356  AMMONIA 19   Coagulation Profile: Recent Labs  Lab 12/20/21 2027  INR 1.2   Cardiac Enzymes: Recent Labs  Lab 12/20/21 1517  CKTOTAL 64   BNP (last 3 results) No results for input(s): "PROBNP" in the last 8760 hours. HbA1C: No results for input(s): "HGBA1C" in the last 72 hours. CBG: No results for input(s): "GLUCAP" in the last 168 hours. Lipid Profile: No results for input(s): "CHOL", "HDL", "LDLCALC", "TRIG", "CHOLHDL", "LDLDIRECT" in the last 72 hours. Thyroid Function Tests: No results for input(s): "TSH", "T4TOTAL", "FREET4", "T3FREE", "THYROIDAB" in the last 72 hours. Anemia Panel: No results for input(s): "VITAMINB12", "FOLATE", "FERRITIN", "TIBC", "IRON", "RETICCTPCT" in the last 72 hours. Sepsis Labs: Recent Labs  Lab 12/20/21 1528 12/20/21 2027 12/20/21 2356 12/21/21 0555  PROCALCITON <0.10  --   --  <0.10  LATICACIDVEN  --  0.9 1.5  --     Recent Results (from the past 240 hour(s))  Culture, blood (routine x 2)     Status: None (Preliminary result)   Collection Time: 12/20/21  8:27 PM   Specimen: BLOOD  Result Value Ref Range Status   Specimen Description   Final    BLOOD LEFT ANTECUBITAL Performed at [facility], [place] [address] [place], [place] [address]    Special Requests   Final    BOTTLES DRAWN AEROBIC AND ANAEROBIC Blood Culture results may not be optimal due to an excessive volume of blood received in culture bottles Performed at [place] [address], [place], [place] [address]    Culture    Final    NO GROWTH 3 DAYS Performed at [facility], [place] [address], [place], [place] [address]    Report Status PENDING  Incomplete  Culture, blood (routine x 2)     Status: None (Preliminary result)   Collection Time: 12/20/21  8:41 PM   Specimen: BLOOD  Result Value Ref Range Status   Specimen Description   Final    BLOOD RIGHT ANTECUBITAL Performed at [place] [address] [place], [place] [address]    Special Requests   Final    BOTTLES DRAWN AEROBIC AND ANAEROBIC Blood Culture adequate volume Performed at [place] [address], [place], [place] [address]    Culture   Final    NO GROWTH 3 DAYS  Performed at [facility], [place] [address] [place], [place] [address]    Report Status PENDING  Incomplete  MRSA Next Gen by PCR, Nasal     Status: None   Collection Time: 12/20/21  9:46 PM   Specimen: Nasal Mucosa; Nasal Swab  Result Value Ref Range Status   MRSA by PCR Next Gen NOT DETECTED NOT DETECTED Final    Comment: (NOTE) The GeneXpert MRSA Assay (FDA approved for NASAL specimens only), is one component of a comprehensive MRSA colonization surveillance program. It is not intended to diagnose MRSA infection nor to guide or monitor treatment for MRSA infections. Test performance is not FDA approved in patients less than 62 years old. Performed at [facility], [place] [address] [place], [place] [address]     Radiology Studies: No results found.  Scheduled Meds:  sodium chloride   Intravenous Once   multivitamin with minerals  1 tablet Oral Daily   nicotine  14 mg Transdermal Daily   nutrition supplement (JUVEN)  1 packet Oral BID BM   pantoprazole (PROTONIX) IV  40 mg Intravenous Q24H   technetium labeled red blood cells  25 millicurie Intravenous Once   Continuous Infusions:  cefTRIAXone (ROCEPHIN)  IV 2 g (12/23/21 1840)    LOS: 4 days   Raiford Noble, DO [facility] Available via Epic secure chat 7am-7pm After these hours, please refer to coverage provider listed on [email or web address] 12/24/2021, 4:42 PM

## 2021-12-25 DIAGNOSIS — I10 Essential (primary) hypertension: Secondary | ICD-10-CM | POA: Diagnosis not present

## 2021-12-25 DIAGNOSIS — E876 Hypokalemia: Secondary | ICD-10-CM | POA: Diagnosis not present

## 2021-12-25 DIAGNOSIS — L03115 Cellulitis of right lower limb: Secondary | ICD-10-CM | POA: Diagnosis not present

## 2021-12-25 DIAGNOSIS — N179 Acute kidney failure, unspecified: Secondary | ICD-10-CM | POA: Diagnosis not present

## 2021-12-25 LAB — CBC WITH DIFFERENTIAL/PLATELET
Abs Immature Granulocytes: 0.03 10*3/uL (ref 0.00–0.07)
Basophils Absolute: 0 10*3/uL (ref 0.0–0.1)
Basophils Relative: 0 %
Eosinophils Absolute: 0.3 10*3/uL (ref 0.0–0.5)
Eosinophils Relative: 5 %
HCT: 22.6 % — ABNORMAL LOW (ref 36.0–46.0)
Hemoglobin: 7.5 g/dL — ABNORMAL LOW (ref 12.0–15.0)
Immature Granulocytes: 0 %
Lymphocytes Relative: 25 %
Lymphs Abs: 1.8 10*3/uL (ref 0.7–4.0)
MCH: 31 pg (ref 26.0–34.0)
MCHC: 33.2 g/dL (ref 30.0–36.0)
MCV: 93.4 fL (ref 80.0–100.0)
Monocytes Absolute: 0.9 10*3/uL (ref 0.1–1.0)
Monocytes Relative: 13 %
Neutro Abs: 4.1 10*3/uL (ref 1.7–7.7)
Neutrophils Relative %: 57 %
Platelets: 321 10*3/uL (ref 150–400)
RBC: 2.42 MIL/uL — ABNORMAL LOW (ref 3.87–5.11)
RDW: 14.5 % (ref 11.5–15.5)
WBC: 7.3 10*3/uL (ref 4.0–10.5)
nRBC: 0 % (ref 0.0–0.2)

## 2021-12-25 LAB — PHOSPHORUS: Phosphorus: 3.2 mg/dL (ref 2.5–4.6)

## 2021-12-25 LAB — PREPARE RBC (CROSSMATCH)

## 2021-12-25 LAB — COMPREHENSIVE METABOLIC PANEL
ALT: 10 U/L (ref 0–44)
AST: 15 U/L (ref 15–41)
Albumin: 2.1 g/dL — ABNORMAL LOW (ref 3.5–5.0)
Alkaline Phosphatase: 64 U/L (ref 38–126)
Anion gap: 4 — ABNORMAL LOW (ref 5–15)
BUN: 7 mg/dL (ref 6–20)
CO2: 23 mmol/L (ref 22–32)
Calcium: 8.3 mg/dL — ABNORMAL LOW (ref 8.9–10.3)
Chloride: 115 mmol/L — ABNORMAL HIGH (ref 98–111)
Creatinine, Ser: 0.49 mg/dL (ref 0.44–1.00)
GFR, Estimated: >60 mL/min (ref >60)
Glucose, Bld: 94 mg/dL (ref 70–99)
Potassium: 3.7 mmol/L (ref 3.5–5.1)
Sodium: 142 mmol/L (ref 135–145)
Total Bilirubin: 0.5 mg/dL (ref 0.3–1.2)
Total Protein: 5.7 g/dL — ABNORMAL LOW (ref 6.5–8.1)

## 2021-12-25 LAB — CBC
HCT: 25.1 % — ABNORMAL LOW (ref 36.0–46.0)
Hemoglobin: 8.1 g/dL — ABNORMAL LOW (ref 12.0–15.0)
MCH: 30.2 pg (ref 26.0–34.0)
MCHC: 32.3 g/dL (ref 30.0–36.0)
MCV: 93.7 fL (ref 80.0–100.0)
Platelets: 423 10*3/uL — ABNORMAL HIGH (ref 150–400)
RBC: 2.68 MIL/uL — ABNORMAL LOW (ref 3.87–5.11)
RDW: 14.7 % (ref 11.5–15.5)
WBC: 8.2 10*3/uL (ref 4.0–10.5)
nRBC: 0.2 % (ref 0.0–0.2)

## 2021-12-25 LAB — SURGICAL PATHOLOGY

## 2021-12-25 LAB — MAGNESIUM: Magnesium: 1.9 mg/dL (ref 1.7–2.4)

## 2021-12-25 MED ORDER — JUVEN PO PACK: 1.0000 | PACK | Freq: Two times a day (BID) | ORAL | 0 refills | Status: AC

## 2021-12-25 MED ORDER — SODIUM CHLORIDE 0.9% IV SOLUTION: Freq: Once | INTRAVENOUS | Status: AC

## 2021-12-25 MED ORDER — ACETAMINOPHEN 325 MG PO TABS: 650.0000 mg | ORAL_TABLET | Freq: Four times a day (QID) | ORAL | 0 refills | Status: AC | PRN

## 2021-12-25 MED ORDER — PSYLLIUM 95 % PO PACK
1.0000 | PACK | Freq: Every day | ORAL | Status: DC
  Administered 2021-12-25: 1 via ORAL
  Filled 2021-12-25: qty 1

## 2021-12-25 MED ORDER — TRAMADOL HCL 50 MG PO TABS
50.0000 mg | ORAL_TABLET | Freq: Four times a day (QID) | ORAL | 0 refills | Status: DC | PRN
Start: 2021-12-25 — End: 2021-12-25

## 2021-12-25 MED ORDER — PSYLLIUM 95 % PO PACK: 1.0000 | PACK | Freq: Every day | ORAL | 0 refills | Status: AC

## 2021-12-25 MED ORDER — HYDROCODONE-ACETAMINOPHEN 5-325 MG PO TABS
1.0000 | ORAL_TABLET | Freq: Four times a day (QID) | ORAL | 0 refills | Status: DC | PRN
Start: 2021-12-25 — End: 2022-01-06

## 2021-12-25 MED ORDER — ADULT MULTIVITAMIN W/MINERALS CH: 1.0000 | ORAL_TABLET | Freq: Every day | ORAL | 0 refills | Status: AC

## 2021-12-25 MED ORDER — CEFDINIR 300 MG PO CAPS: 300.0000 mg | ORAL_CAPSULE | Freq: Two times a day (BID) | ORAL | 0 refills | Status: AC

## 2021-12-25 MED ORDER — CEFDINIR 300 MG PO CAPS
300.0000 mg | ORAL_CAPSULE | Freq: Two times a day (BID) | ORAL | Status: DC
  Filled 2021-12-25: qty 1

## 2021-12-25 MED ORDER — NICOTINE 14 MG/24HR TD PT24: 14.0000 mg | MEDICATED_PATCH | Freq: Every day | TRANSDERMAL | 0 refills | Status: AC

## 2021-12-25 MED ORDER — PANTOPRAZOLE SODIUM 40 MG PO TBEC: 40.0000 mg | DELAYED_RELEASE_TABLET | Freq: Every day | ORAL | 1 refills | Status: AC

## 2021-12-25 NOTE — TOC Progression Note (Signed)
Transition of Care [facility]) - Progression Note    Patient Details  Name: Teresa Phillips MRN: [id number] Date of Birth: 01-30-1966  Transition of Care [facility] CM/SW Contact  Golda Acre, RN Phone Number: 12/25/2021, 8:27 AM  Clinical Narrative:    Chart Reviewed.  Following for toc needs.   Expected Discharge Plan: Home/Self Care Barriers to Discharge: Continued Medical Work up  Expected Discharge Plan and Services Expected Discharge Plan: Home/Self Care   Discharge Planning Services: CM Consult   Living arrangements for the past 2 months: Single Family Home                                       Social Determinants of Health (SDOH) Interventions    Readmission Risk Interventions     No data to display

## 2021-12-25 NOTE — Progress Notes (Signed)
Patient ok per MD to discharge with follow up on PCP need. TOC order placed and spoke with ED TOC who said she will put in a note and follow up with patient on Monday about PCP. Patient updated and agrees to discharge.

## 2021-12-25 NOTE — Evaluation (Signed)
Occupational Therapy Evaluation Patient Details Name: Teresa Phillips MRN: [id number] DOB: 1965/10/18 Today's Date: 12/25/2021   History of Present Illness 56 y.o. female presented to the hospital with right leg swelling and pain. Dx of cellulitis, GIB, AKI, anemia.   Clinical Impression   Teresa Phillips is a 56 year old woman who presents with RLE pain. Patient educated on edema reduction techniques including ice and elevation. Discussed compensatory strategies for dressing and bathing. Patient has no further OT needs.       Recommendations for follow up therapy are one component of a multi-disciplinary discharge planning process, led by the attending physician.  Recommendations may be updated based on patient status, additional functional criteria and insurance authorization.   Follow Up Recommendations  No OT follow up    Assistance Recommended at Discharge None  Patient can return home with the following      Functional Status Assessment  Patient has not had a recent decline in their functional status  Equipment Recommendations  None recommended by OT    Recommendations for Other Services       Precautions / Restrictions Precautions Precautions: None Precaution Comments: pt denies h/o falls in past 6 months Restrictions Weight Bearing Restrictions: No      Mobility Bed Mobility Overal bed mobility: Independent                  Transfers Overall transfer level: Independent                 General transfer comment: more comfortable with walker to off load weight      Balance Overall balance assessment: Independent                                         ADL either performed or assessed with clinical judgement   ADL Overall ADL's : Modified independent                                             Vision Patient Visual Report: No change from baseline       Perception     Praxis      Pertinent Vitals/Pain  Pain Assessment Pain Score: 3  Pain Location: R ankle with walking Pain Descriptors / Indicators: Sore, Grimacing Pain Intervention(s): Monitored during session     Hand Dominance     Extremity/Trunk Assessment Upper Extremity Assessment Upper Extremity Assessment: Overall WFL for tasks assessed   Lower Extremity Assessment Lower Extremity Assessment: Overall WFL for tasks assessed RLE Deficits / Details: ankle DF/PF AROM decr ~50% 2* pain/swelling, knee ext at least 3/5 (did not perform MMT 2* pain) RLE Sensation: WNL   Cervical / Trunk Assessment Cervical / Trunk Assessment: Normal   Communication Communication Communication: No difficulties   Cognition Arousal/Alertness: Awake/alert Behavior During Therapy: WFL for tasks assessed/performed Overall Cognitive Status: Within Functional Limits for tasks assessed                                       General Comments       Exercises     Shoulder Instructions      Home Living Family/patient expects to be discharged to::  Private residence Living Arrangements: Non-relatives/Friends Available Help at Discharge: Friend(s);Available 24 hours/day Type of Home: House Home Access: Stairs to enter [employer] of Steps: 4   Home Layout: One level     Bathroom Shower/Tub: Tub/shower unit         Home Equipment: Gilmer Mor - single point   Additional Comments: lives with 2 friends      Prior Functioning/Environment Prior Level of Function : Independent/Modified Independent;Driving             Mobility Comments: worked 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 ADLs Comments: independent        OT Problem List: Pain      OT Treatment/Interventions:      OT Goals(Current goals can be found in the care plan section) Acute Rehab OT Goals OT Goal Formulation: All assessment and education complete, DC therapy  OT Frequency:      Co-evaluation              AM-PAC OT "6 Clicks" Daily Activity      Outcome Measure Help from another person eating meals?: None Help from another person taking care of personal grooming?: None Help from another person toileting, which includes using toliet, bedpan, or urinal?: None Help from another person bathing (including washing, rinsing, drying)?: None Help from another person to put on and taking off regular upper body clothing?: None Help from another person to put on and taking off regular lower body clothing?: None 6 Click Score: 24   End of Session Equipment Utilized During Treatment: Rolling walker (2 wheels) Nurse Communication:  (Okay to discharge, no needs)  Activity Tolerance: Patient tolerated treatment well Patient left: in chair  OT Visit Diagnosis: Pain                Time: 1425-1440 OT Time Calculation (min): 15 min Charges:  OT General Charges $OT Visit: 1 Visit OT Evaluation $OT Eval Low Complexity: 1 Low  Lashay Osborne, OTR/L Acute Care Rehab Services  Office [phone] Pager: [phone]   Kelli Churn 12/25/2021, 4:13 PM

## 2021-12-25 NOTE — Plan of Care (Signed)
  Problem: Education: Goal: Knowledge of General Education information will improve Description: Including pain rating scale, medication(s)/side effects and non-pharmacologic comfort measures Outcome: Adequate for Discharge   Problem: Health Behavior/Discharge Planning: Goal: Ability to manage health-related needs will improve Outcome: Adequate for Discharge   Problem: Clinical Measurements: Goal: Ability to maintain clinical measurements within normal limits will improve Outcome: Adequate for Discharge Goal: Will remain free from infection Outcome: Adequate for Discharge Goal: Diagnostic test results will improve Outcome: Adequate for Discharge Goal: Respiratory complications will improve Outcome: Adequate for Discharge Goal: Cardiovascular complication will be avoided Outcome: Adequate for Discharge   Problem: Activity: Goal: Risk for activity intolerance will decrease Outcome: Adequate for Discharge   Problem: Nutrition: Goal: Adequate nutrition will be maintained Outcome: Adequate for Discharge   Problem: Coping: Goal: Level of anxiety will decrease Outcome: Adequate for Discharge   Problem: Elimination: Goal: Will not experience complications related to bowel motility Outcome: Adequate for Discharge Goal: Will not experience complications related to urinary retention Outcome: Adequate for Discharge   Problem: Pain Managment: Goal: General experience of comfort will improve Outcome: Adequate for Discharge   Problem: Safety: Goal: Ability to remain free from injury will improve Outcome: Adequate for Discharge   Problem: Skin Integrity: Goal: Risk for impaired skin integrity will decrease Outcome: Adequate for Discharge   Problem: Clinical Measurements: Goal: Ability to avoid or minimize complications of infection will improve Outcome: Adequate for Discharge   Problem: Skin Integrity: Goal: Skin integrity will improve Outcome: Adequate for Discharge    Problem: Fluid Volume: Goal: Hemodynamic stability will improve Outcome: Adequate for Discharge   Problem: Clinical Measurements: Goal: Diagnostic test results will improve Outcome: Adequate for Discharge Goal: Signs and symptoms of infection will decrease Outcome: Adequate for Discharge   Problem: Respiratory: Goal: Ability to maintain adequate ventilation will improve Outcome: Adequate for Discharge

## 2021-12-25 NOTE — Evaluation (Addendum)
Physical Therapy Evaluation Patient Details Name: Teresa Phillips MRN: [id number] DOB: 1966-06-08 Today's Date: 12/25/2021  History of Present Illness  56 y.o. female presented to the hospital with right leg swelling and pain. Dx of cellulitis, GIB, AKI, anemia.  Clinical Impression  Pt ambulated 150' (15' without a device, 76' with a RW) without loss of balance. Pt stated use of RW helped decrease pain in RLE with ambulation. Encouraged elevation of RLE and ankle pumps for edema control. She is mobilizing at a modified independent level and is safe to DC home from a PT standpoint.  No further PT indicated, will sign off.        Recommendations for follow up therapy are one component of a multi-disciplinary discharge planning process, led by the attending physician.  Recommendations may be updated based on patient status, additional functional criteria and insurance authorization.  Follow Up Recommendations No PT follow up    Assistance Recommended at Discharge    Patient can return home with the following  Assistance with cooking/housework;Assist for transportation;Help with stairs or ramp for entrance    Equipment Recommendations Rolling walker (2 wheels)  Recommendations for Other Services       Functional Status Assessment Patient has had a recent decline in their functional status and demonstrates the ability to make significant improvements in function in a reasonable and predictable amount of time.     Precautions / Restrictions Precautions Precautions: None Precaution Comments: pt denies h/o falls in past 6 months Restrictions Weight Bearing Restrictions: No      Mobility  Bed Mobility Overal bed mobility: Independent                  Transfers Overall transfer level: Independent                      Ambulation/Gait Ambulation/Gait assistance: Independent, Modified independent (Device/Increase time) Gait Distance (Feet): 150 Feet Assistive device:  Rolling walker (2 wheels) Gait Pattern/deviations: Step-through pattern, Decreased stride length Gait velocity: decr     General Gait Details: ambulated 34' without a device, then 2' with RW for comfort (to facilitate decr weightbearing on RLE)  Stairs            Wheelchair Mobility    Modified Rankin (Stroke Patients Only)       Balance Overall balance assessment: Independent                                           Pertinent Vitals/Pain Pain Assessment Pain Assessment: 0-10 Pain Score: 3  Pain Location: R ankle with walking Pain Descriptors / Indicators: Sore Pain Intervention(s): Limited activity within patient's tolerance    Home Living Family/patient expects to be discharged to:: Private residence Living Arrangements: Non-relatives/Friends Available Help at Discharge: Friend(s);Available 24 hours/day Type of Home: House Home Access: Stairs to enter   [employer] of Steps: 4   Home Layout: One level Home Equipment: Cane - single point Additional Comments: lives with 2 friends    Prior Function Prior Level of Function : Independent/Modified Independent;Driving             Mobility Comments: worked 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 ADLs Comments: independent     Higher education careers adviser        Extremity/Trunk Assessment   Upper Extremity Assessment Upper Extremity Assessment: Defer to OT evaluation  Lower Extremity Assessment Lower Extremity Assessment: RLE deficits/detail RLE Deficits / Details: ankle DF/PF AROM decr ~50% 2* pain/swelling, knee ext at least 3/5 (did not perform MMT 2* pain) RLE Sensation: WNL    Cervical / Trunk Assessment Cervical / Trunk Assessment: Normal  Communication   Communication: No difficulties  Cognition Arousal/Alertness: Awake/alert Behavior During Therapy: WFL for tasks assessed/performed Overall Cognitive Status: Within Functional Limits for tasks assessed                                           General Comments      Exercises     Assessment/Plan    PT Assessment Patient does not need any further PT services  PT Problem List         PT Treatment Interventions      PT Goals (Current goals can be found in the Care Plan section)  Acute Rehab PT Goals Patient Stated Goal: return to work, decrease pain PT Goal Formulation: All assessment and education complete, DC therapy    Frequency       Co-evaluation               AM-PAC PT "6 Clicks" Mobility  Outcome Measure Help needed turning from your back to your side while in a flat bed without using bedrails?: None Help needed moving from lying on your back to sitting on the side of a flat bed without using bedrails?: None Help needed moving to and from a bed to a chair (including a wheelchair)?: None Help needed standing up from a chair using your arms (e.g., wheelchair or bedside chair)?: None Help needed to walk in hospital room?: None Help needed climbing 3-5 steps with a railing? : None 6 Click Score: 24    End of Session   Activity Tolerance: Patient tolerated treatment well Patient left: in chair;with call bell/phone within reach Nurse Communication: Mobility status      Time: 1419-1440 PT Time Calculation (min) (ACUTE ONLY): 21 min   Charges:   PT Evaluation $PT Eval Moderate Complexity: 1 Mod          Tamala Ser PT 12/25/2021  Acute Rehabilitation Services  Office [phone]

## 2021-12-25 NOTE — Discharge Summary (Signed)
Physician Discharge Summary   Patient: Teresa Phillips MRN: [id number] DOB: 04-24-1966  Admit date:     12/20/2021  Discharge date: 12/25/21  Discharge Physician: Raiford [place], DO   PCP: Patient, No Pcp Per   Recommendations at discharge:   Follow-up with PCP within 1 to 2 weeks and repeat CBC, CMP, mag, Phos within 1 week Follow-up with gastroenterology within 1 to 2 weeks and continue with pantoprazole 40 mg once daily for 2 months and continuing fiber supplementation and avoiding NSAIDs and aspirin  Discharge Diagnoses: Principal Problem:   Cellulitis of right leg Active Problems:   Normocytic anemia   Hypokalemia   Essential hypertension   Tobacco abuse   Upper GI bleed   AKI (acute kidney injury) ([place])   Anemia   Cellulitis of right lower extremity  Resolved Problems:   * No resolved hospital problems. [facility] Course: The patient is a 56 year old female with hypertension who  has insurance but does not see a PCP. She presented to the hospital with right leg swelling and pain. The patient states that 2 weeks ago the right leg became swollen and blistered.  She was given 10 days of doxycycline 1 at an urgent care and took the entire course but this did not help.  Blisters have been draining and the leg is still swollen and very painful.  She has been applying topical Neosporin.  She has been taking ibuprofen and Excedrin for pain.  She has been vomiting.  She also reported some blood in the urine that occurred the day before she came to the ED.   In the ED: Noted to have 3+ edema of the right leg.  Venous duplex was negative for DVT.  X-ray of the foot and leg revealed subcutaneous edema and soft tissue swelling.  She was started on cefepime and vancomycin. She was also noted to have a hemoglobin of 7.8 and a potassium of 2.8. Initially declined a blood transfusion but the following day when hemoglobin was 6.1, she agreed to transfusion.  GI was consulted because the patient was  having darker red stool and recommended an EGD and colonoscopy but the day before yesterday she declined EGD and colonoscopy but she has agreed to them and underwent procedure today.  EGD showed that she had a widely patent Schatzki ring found at the GE junction and a 1 cm hiatal hernia with a few nonbleeding cratered and superficial gastric ulcers with a clean ulcer base mid gastric antrum and pylorus.  Biopsies were taken and testing was sent for H. pylori.  Colonoscopy was done and showed diverticulosis in the sigmoid colon, descending colon, transverse colon and descending colon as well as a 4 mm polyp in the ascending colon and two 3 to 4 mm polyps in the rectum and the sigmoid colon that were removed.   GI has now patient on regular diet and switched protonix to 40 mg once a day and recommending she needs to be on PPI once a day for 30 days.    Unfortunately the patient had some bright red blood per her rectum this morning and had multiple bloody bowel movements with clots.  GI was reconsulted and blood count had dropped to 7.1 so she was typed and screened and transfused another 1 unit PRBCs.  GI recommending a nuclear medicine GI blood loss scan and they feel that that both of her rectal bleeding are related to acute diverticular bleeding.  They feel that that the nuclear medicine  GI blood loss scan is positive they recommend IR evaluation for CT angiogram and embolization.   The patient's nuclear medicine GI blood loss scan was negative for acute bleed and she had a bowel movement this morning that was nonbloody.  Hemoglobin remained stable and she is medically stable to be discharged given GI clearance.  Her cellulitis is improving and she is transition to oral antibiotics.  She will need to establish with and follow-up with PCP within 1 to 2 weeks and also follow-up with GI in outpatient setting and continue PPI 40 mg of Protonix daily.  She is much improved and stable for discharge and PT OT  recommending no follow-up now   Assessment and Plan:  Cellulitis of right leg, improving slowly  - failed outpatient treatment with doxycycline - Negative for DVT and negative for bony abnormalities per x-ray - CRP 11.9, ESR greater than 150 - CT does not show an abscess-  dc'd Vanc and change Cefepime to Ceftriaxone on 6/12 and will continue but then changed to p.o. for discharge - blood cx are negative so far - have asked patient and RN to elevate the leg above the abdomen - On Tramadol, Fentanyl IV and Hydrocodone PRN as ordered by the admitting doctor -Will need PT/OT To evaluate and Treat and recommending no follow-up   AKI, improved  -Creatinine in 2021 was 0.7 -Presented with a creatinine of 1.01 which has improved and now BUN/Cr is 11/0.48 -Avoid further nephrotoxic medications, contrast dyes, hypotension and dehydration and renally adjust medications -Repeat CMP in the a.m.   Normocytic Anemia with Acute Blood Loss Anemia -Has had rectal bleeding in the hospital-last episode was this yesterday morning- dark blood noted -The last hemoglobin we have prior to this was 14.6 in 2021 -s/p 2 units of pRBCs and will get a 3rd unit today  -Anemia panel reviewed-iron is 21 and slightly low, iron saturation is 11 and normal, ferritin is 191 and normal, folate is 10.5 and normal, vitamin B12 is 561 and normal -Receiving Protonix twice daily IV and now GI has stopped this and changed to p.o. Protonix -GI took the patient for an EGD and colonoscopy -EGD showed that she had a widely patent Schatzki ring found at the GE junction and a 1 cm hiatal hernia with a few nonbleeding cratered and superficial gastric ulcers with a clean ulcer base mid gastric antrum and pylorus.  Biopsies were taken and testing was sent for H. pylori.   -Colonoscopy was done and showed diverticulosis in the sigmoid colon, descending colon, transverse colon and descending colon as well as a 4 mm polyp in the ascending  colon and two 3 to 4 mm polyps in the rectum and the sigmoid colon that were removed. -Hemoglobin/hematocrit went from 7.5/22.9 and trended down to 7.1/22.0 but she continues to have some GI bleeding and had 3 episodes of bright red blood per rectum yesterday with clots; repeat blood work this morning showed hemoglobin/hematocrit of 7.5/22.6 and repeat this afternoon was 8.1/25.1 and stable -GI was reconsulted and recommending nuclear medicine GI bleeding scan and if it is positive consulting IR for CTA and embolization -Her GI bleeding scan was negative and hemoglobin remained stable.  GI evaluated and felt that she could be discharged home and she will be discharged home on p.o. Protonix and fiber and avoid NSAIDs and aspirin   Thrombocytosis -Platelets count is now gone from 428 -> 425 -> 409 -> 403 and repeat this afternoon was 423 -Continue monitor  for signs and symptoms of bleeding; -Repeat CBC in a.m.   Hypokalemia -Potassium is now 3.7 -Continue to monitor and replete as necessary and repeat CMP in a.m.    Essential Hypertension -Continuing to follow and avoid treating while she is actively bleeding -Continue to monitor blood pressures per protocol -Last blood pressure reading was 123/65   Tobacco Abuse -Nicotine patch 14 mg TD Daily  -Smoking Cessation Counseling given    Hypoalbuminemia -Patient's albumin level went from 2.2 is now 2.5 yesterday and today is 2.1 -Continue monitor and trend and repeat CMP in a.m.   Obesity -Complicates overall prognosis and care -Estimated body mass index is 36.64 kg/m as calculated from the following:   Height as of this encounter: 5' 8"  (1.727 m).   Weight as of this encounter: 109.3 kg.  -Weight Loss and Dietary Counseling given   Nutrition Documentation    [place] [facility] to Hosp-Admission (Discharged) from 12/20/2021 in [place]  Nutrition Problem Increased nutrient needs  Etiology wound  healing  Nutrition Goal Patient will meet greater than or equal to 90% of their needs  Interventions MVI, Juven      Consultants: Gastroenterology  Procedures performed:  EGD Findings:      The examined esophagus was normal.      A widely patent Schatzki ring was found at the gastroesophageal junction.      A 1 cm hiatal hernia was present.      Few non-bleeding cratered and superficial gastric ulcers with a clean       ulcer base (Forrest Class III) were found in the gastric body, in the       gastric antrum and at the pylorus. The largest lesion was 6 mm in       largest dimension. Biopsies were taken with a cold forceps for       Helicobacter pylori testing.      The cardia and gastric fundus were normal on retroflexion.      Diffuse moderately erythematous mucosa without active bleeding and with       no stigmata of bleeding was found in the duodenal bulb.      The ampulla, first portion of the duodenum and second portion of the       duodenum were normal. Impression:               - Normal esophagus.                           - Widely patent Schatzki ring.                           - 1 cm hiatal hernia.                           - Non-bleeding gastric ulcers with a clean ulcer                            base (Forrest Class III). Biopsied.                           - Erythematous duodenopathy.                           -  Normal ampulla, first portion of the duodenum and                            second portion of the duodenum.   COLONOSCOPY Findings:      The perianal and digital rectal examinations were normal.      The terminal ileum appeared normal.      Scattered small and large-mouthed diverticula were found in the sigmoid       colon, descending colon, transverse colon and ascending colon.      A 4 mm polyp was found in the ascending colon. The polyp was sessile.       The polyp was removed with a cold biopsy forceps. Resection and       retrieval were complete.       Two sessile polyps were found in the rectum and sigmoid colon. The       polyps were 3 to 4 mm in size. These polyps were removed with a cold       biopsy forceps. Resection and retrieval were complete.      The exam was otherwise without abnormality on direct and retroflexion       views. Impression:               - The examined portion of the ileum was normal.                           - Diverticulosis in the sigmoid colon, in the                            descending colon, in the transverse colon and in                            the ascending colon.                           - One 4 mm polyp in the ascending colon, removed                            with a cold biopsy forceps. Resected and retrieved.                           - Two 3 to 4 mm polyps in the rectum and in the                            sigmoid colon, removed with a cold biopsy forceps.                            Resected and retrieved.                           - The examination was otherwise normal on direct                            and retroflexion views. Moderate Sedation:      Patient did not receive moderate sedation for this procedure, but  instead received monitored anesthesia care. Recommendation:           - High fiber diet.                           - Continue present medications.                           - Await pathology results.                           - Repeat colonoscopy for surveillance based on                            pathology results.   Disposition: Home Diet recommendation:  Discharge Diet Orders (From admission, onward)     Start     Ordered   12/25/21 0000  Diet - low sodium heart healthy        12/25/21 1355           Cardiac diet DISCHARGE MEDICATION: Allergies as of 12/25/2021       Reactions   Avocado Other (See Comments)   welps.   Shellfish Allergy Other (See Comments)   welps.        Medication List     TAKE these medications    acetaminophen 325 MG  tablet Commonly known as: TYLENOL Take 2 tablets (650 mg total) by mouth every 6 (six) hours as needed for mild pain (or Fever >/= 101).   cefdinir 300 MG capsule Commonly known as: OMNICEF Take 1 capsule (300 mg total) by mouth every 12 (twelve) hours for 12 doses.   HYDROcodone-acetaminophen 5-325 MG tablet Commonly known as: NORCO/VICODIN Take 1 tablet by mouth every 6 (six) hours as needed for moderate pain.   multivitamin with minerals Tabs tablet Take 1 tablet by mouth daily. Start taking on: December 26, 2021   nicotine 14 mg/24hr patch Commonly known as: NICODERM CQ - dosed in mg/24 hours Place 1 patch (14 mg total) onto the skin daily. Start taking on: December 26, 2021   nutrition supplement (JUVEN) Pack Take 1 packet by mouth 2 (two) times daily between meals. Start taking on: December 26, 2021   pantoprazole 40 MG tablet Commonly known as: Protonix Take 1 tablet (40 mg total) by mouth daily.   psyllium 95 % Pack Commonly known as: HYDROCIL/METAMUCIL Take 1 packet by mouth daily. Start taking on: December 26, 2021               Durable Medical Equipment  (From admission, onward)           Start     Ordered   12/25/21 1453  For home use only DME 4 wheeled rolling walker with seat  Once       Question:  Patient needs a walker to treat with the following condition  Answer:  Gait abnormality   12/25/21 1452            Discharge Exam: [facility] Weights   12/23/21 1035  Weight: 109.3 kg   Vitals:   12/25/21 0500 12/25/21 1313  BP: (!) 149/74 123/65  Pulse: 86 88  Resp: 18 17  Temp: 98.3 F (36.8 C) 98.1 F (36.7 C)  SpO2: 100% 100%   Examination: Physical Exam:  Constitutional: WN/WD obese African-American female currently no acute distress appears improved  and better. Respiratory: Diminished to auscultation bilaterally, no wheezing, rales, rhonchi or crackles. Normal respiratory effort and patient is not tachypenic. No accessory muscle use.  Unlabored  breathing Cardiovascular: RRR, no murmurs / rubs / gallops. S1 and S2 auscultated.  Right leg is still a little erythematous but not as much and not as swollen and the warmth is improved Abdomen: Soft, non-tender, distended secondary body habitus.  Bowel sounds positive.  GU: Deferred. Musculoskeletal: No clubbing / cyanosis of digits/nails. No joint deformity upper and lower extremities.   Neurologic: CN 2-12 grossly intact with no focal deficits. Romberg sign and cerebellar reflexes not assessed.  Psychiatric: Normal judgment and insight. Alert and oriented x 3. Normal mood and appropriate affect.   Condition at discharge: stable  The results of significant diagnostics from this hospitalization (including imaging, microbiology, ancillary and laboratory) are listed below for reference.   Imaging Studies: NM GI Blood Loss  Result Date: 12/24/2021 CLINICAL DATA:  Blood in the stool EXAM: NUCLEAR MEDICINE GASTROINTESTINAL BLEEDING SCAN TECHNIQUE: Sequential abdominal images were obtained following intravenous administration of Tc-29mlabeled red blood cells. RADIOPHARMACEUTICALS:  20.6 mCi Tc-953mertechnetate in-vitro labeled red cells. COMPARISON:  None Available. FINDINGS: There is no extravasation of tracer during the study. IMPRESSION: There is no demonstrable active extravasation of tracer in the abdomen and pelvis during the study. Electronically Signed   By: PaElmer Picker.D.   On: 12/24/2021 16:47   VAS USKoreaOWER EXTREMITY VENOUS (DVT) (7a-7p)  Result Date: 12/21/2021  Lower Venous DVT Study Patient Name:  Anaisa C Loletha GrayerHITE  Date of Exam:   12/20/2021 Medical Rec #: [id number]   Accession #:    [id number] of Birth: 5/16-Sep-1965   Patient Gender: F Patient Age:   [age]ears [facility]      VAS US[place]OWER EXTREMITY VENOUS (DVT) Referring Phys: SOChaparrito-------------------------------------------------------------------------------  Indications: Pain,  Swelling, and Erythema. Other Indications: Patient diagnosed with cellulitis last week (compliant with                    antibiotics) with no relief in symptoms. Comparison Study: No previous exams Performing Technologist: Jody Hill RVT, RDMS  Examination Guidelines: A complete evaluation includes B-mode imaging, spectral Doppler, color Doppler, and power Doppler as needed of all accessible portions of each vessel. Bilateral testing is considered an integral part of a complete examination. Limited examinations for reoccurring indications may be performed as noted. The reflux portion of the exam is performed with the patient in reverse Trendelenburg.  +---------+---------------+---------+-----------+----------+-------------------+ RIGHT    CompressibilityPhasicitySpontaneityPropertiesThrombus Aging      +---------+---------------+---------+-----------+----------+-------------------+ CFV      Full           Yes      Yes                                      +---------+---------------+---------+-----------+----------+-------------------+ SFJ      Full                                                             +---------+---------------+---------+-----------+----------+-------------------+ FV Prox  Full  Yes      Yes                                      +---------+---------------+---------+-----------+----------+-------------------+ FV Mid   Full           Yes      Yes                                      +---------+---------------+---------+-----------+----------+-------------------+ FV DistalFull           Yes      Yes                                      +---------+---------------+---------+-----------+----------+-------------------+ PFV      Full                                                             +---------+---------------+---------+-----------+----------+-------------------+ POP      Full           Yes      Yes                                       +---------+---------------+---------+-----------+----------+-------------------+ PTV      Full                                                             +---------+---------------+---------+-----------+----------+-------------------+ PERO                                                  Not well visualized +---------+---------------+---------+-----------+----------+-------------------+   +----+---------------+---------+-----------+----------+--------------+ LEFTCompressibilityPhasicitySpontaneityPropertiesThrombus Aging +----+---------------+---------+-----------+----------+--------------+ CFV Full           Yes      Yes                                 +----+---------------+---------+-----------+----------+--------------+    Summary: RIGHT: - There is no evidence of deep vein thrombosis in the lower extremity.  - No cystic structure found in the popliteal fossa. - Subcutaneous edema in area of calf & popliteal fossa. - Ultrasound characteristics of enlarged lymph nodes are noted in the groin.  LEFT: - No evidence of common femoral vein obstruction.  *See table(s) above for measurements and observations. Electronically signed by Servando Snare MD on 12/21/2021 at 6:02:52 PM.    Final    CT TIBIA FIBULA RIGHT W CONTRAST  Result Date: 12/21/2021 CLINICAL DATA:  Concern for osteomyelitis. EXAM: CT OF THE LOWER RIGHT EXTREMITY WITH CONTRAST TECHNIQUE: Multidetector CT imaging of the lower right extremity was performed according to the standard protocol  following intravenous contrast administration. RADIATION DOSE REDUCTION: This exam was performed according to the departmental dose-optimization program which includes automated exposure control, adjustment of the mA and/or kV according to patient size and/or use of iterative reconstruction technique. CONTRAST:  184m OMNIPAQUE IOHEXOL 300 MG/ML  SOLN COMPARISON:  Radiograph dated 12/20/2021. FINDINGS: Bones/Joint/Cartilage There is no  acute fracture or dislocation. The bones are osteopenic. 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 There is a moderate suprapatellar effusion. There are degenerative changes of the tarsal joints with cortical irregularity. Ligaments Suboptimally assessed by CT. Muscles and Tendons No intramuscular fluid collection. Soft tissues There is diffuse skin thickening and subcutaneous edema which may represent cellulitis. Clinical correlation is recommended. No drainable fluid collection/abscess. No soft tissue gas. IMPRESSION: 1. No acute fracture or dislocation. 2. Arthritic changes of the right knee with a moderate suprapatellar effusion. 3. Diffuse skin thickening and subcutaneous edema which may represent cellulitis. No drainable fluid collection/abscess. Electronically Signed   By: AAnner CreteM.D.   On: 12/21/2021 03:07   DG Tibia/Fibula Right  Result Date: 12/20/2021 CLINICAL DATA:  Right lower extremity pain and swelling. EXAM: RIGHT TIBIA AND FIBULA - 2 VIEW; RIGHT FOOT - 2 VIEW COMPARISON:  None Available. FINDINGS: There is no acute fracture or dislocation. Mild arthritic changes of the right knee and degenerative changes of the tarsal joints with irregularity and spurring. There is degenerative changes of the first MTP joint with joint space narrowing and spurring. There is diffuse subcutaneous edema and soft tissue swelling of the dorsum of the foot. No radiopaque foreign object or soft tissue gas. IMPRESSION: 1. No acute fracture or dislocation. 2. Diffuse subcutaneous edema and soft tissue swelling of the dorsum of the foot. Electronically Signed   By: AAnner CreteM.D.   On: 12/20/2021 21:28   DG Foot 2 Views Right  Result Date: 12/20/2021 CLINICAL DATA:  Right lower extremity pain and swelling. EXAM: RIGHT TIBIA AND FIBULA - 2 VIEW; RIGHT FOOT - 2 VIEW COMPARISON:  None Available. FINDINGS: There is no acute  fracture or dislocation. Mild arthritic changes of the right knee and degenerative changes of the tarsal joints with irregularity and spurring. There is degenerative changes of the first MTP joint with joint space narrowing and spurring. There is diffuse subcutaneous edema and soft tissue swelling of the dorsum of the foot. No radiopaque foreign object or soft tissue gas. IMPRESSION: 1. No acute fracture or dislocation. 2. Diffuse subcutaneous edema and soft tissue swelling of the dorsum of the foot. Electronically Signed   By: AAnner CreteM.D.   On: 12/20/2021 21:28   DG Chest 2 View  Result Date: 12/20/2021 CLINICAL DATA:  Swelling, pain in right leg. EXAM: CHEST - 2 VIEW COMPARISON:  01/15/2019 FINDINGS: The heart size and mediastinal contours are within normal limits. Both lungs are clear. The visualized skeletal structures are unremarkable. IMPRESSION: Normal study. Electronically Signed   By: KRolm BaptiseM.D.   On: 12/20/2021 21:28    Microbiology: Results for orders placed or performed during the hospital encounter of 12/20/21  Culture, blood (routine x 2)     Status: None (Preliminary result)   Collection Time: 12/20/21  8:27 PM   Specimen: BLOOD  Result Value Ref Range Status   Specimen Description   Final    BLOOD LEFT ANTECUBITAL Performed at [id number] [place] [address]   Special Requests  Final    BOTTLES DRAWN AEROBIC AND ANAEROBIC Blood Culture results may not be optimal due to an excessive volume of blood received in culture bottles Performed at [facility] [address] [place], [place] [address]    Culture   Final    NO GROWTH 4 DAYS Performed at [facility], [place] [address], [place], [place] [address]    Report Status PENDING  Incomplete  Culture, blood (routine x 2)     Status: None (Preliminary result)   Collection Time: 12/20/21  8:41 PM   Specimen: BLOOD  Result Value Ref Range  Status   Specimen Description   Final    BLOOD RIGHT ANTECUBITAL Performed at [facility] [address] [place], [place] [address]    Special Requests   Final    BOTTLES DRAWN AEROBIC AND ANAEROBIC Blood Culture adequate volume Performed at [facility] [address] [place], [place] [address]    Culture   Final    NO GROWTH 4 DAYS Performed at [facility], [place] [address] [place], [place] [address]    Report Status PENDING  Incomplete  MRSA Next Gen by PCR, Nasal     Status: None   Collection Time: 12/20/21  9:46 PM   Specimen: Nasal Mucosa; Nasal Swab  Result Value Ref Range Status   MRSA by PCR Next Gen NOT DETECTED NOT DETECTED Final    Comment: (NOTE) The GeneXpert MRSA Assay (FDA approved for NASAL specimens only), is one component of a comprehensive MRSA colonization surveillance program. It is not intended to diagnose MRSA infection nor to guide or monitor treatment for MRSA infections. Test performance is not FDA approved in patients less than 56 years old. Performed at [facility], [place] [address], [place], [place] [address]     Labs: CBC: Recent Labs  Lab 12/20/21 1517 12/20/21 2045 12/23/21 1607 12/24/21 0506 12/24/21 2320 12/25/21 0650 12/25/21 1545  WBC 12.0*   < > 9.0 6.8 7.9 7.3 8.2  NEUTROABS 9.5*  --   --  4.3  --  4.1  --   HGB 7.8*   < > 7.5* 7.1* 6.9* 7.5* 8.1*  HCT 24.6*   < > 22.9* 22.0* 20.5* 22.6* 25.1*  MCV 92.8   < > 92.3 93.6 94.0 93.4 93.7  PLT 549*   < > 409* 403* 343 321 423*   < > = values in this interval not displayed.   Basic Metabolic Panel: Recent Labs  Lab 12/20/21 2027 12/20/21 2045 12/21/21 1406 12/22/21 0859 12/23/21 0520 12/24/21 0506 12/25/21 0650  NA  --    < > 143 143 144 142 142  K  --    < > 3.1* 3.2* 3.5 3.5 3.7  CL  --    < > 116* 116* 116* 115* 115*  CO2  --    < > 19* 21* 22 22 23   GLUCOSE  --    < > 136* 94 91 99 94  BUN  --    < >  20 16 14 11 7   CREATININE  --    < > 0.68 0.60 0.62 0.48 0.49  CALCIUM  --    < > 8.8* 8.6* 9.1 8.7* 8.3*  MG 1.8  --   --   --   --  1.9 1.9  PHOS 3.9  --   --   --   --  3.0 3.2   < > =  values in this interval not displayed.   Liver Function Tests: Recent Labs  Lab 12/20/21 1517 12/21/21 0555 12/22/21 0859 12/24/21 0506 12/25/21 0650  AST 13* 13* 11* 16 15  ALT 10 10 10 12 10   ALKPHOS 71 68 73 79 64  BILITOT 0.6 0.4 0.5 0.4 0.5  PROT 7.4 6.4* 6.4* 6.9 5.7*  ALBUMIN 2.7* 2.4* 2.2* 2.5* 2.1*   CBG: No results for input(s): "GLUCAP" in the last 168 hours.  Discharge time spent: greater than 30 minutes.  Signed: Raiford Noble, DO [facility] 12/25/2021

## 2021-12-25 NOTE — TOC Transition Note (Addendum)
Transition of Care [facility]) - CM/SW Discharge Note   Patient Details  Name: Teresa Phillips MRN: [id number] Date of Birth: 1965/12/09  [facility] Care [facility]) CM/SW Contact:  Golda Acre, RN Phone Number: 12/25/2021, 1:56 PM   Clinical Narrative:    Patient dcd to return to home with self care. Rollator for patient ordered through adapt dme   Final next level of care: Home/Self Care Barriers to Discharge: Barriers Resolved   Patient Goals and CMS Choice Patient states their goals for this hospitalization and ongoing recovery are:: to return home CMS Medicare.gov Compare Post Acute Care list provided to:: Patient    Discharge Placement                       Discharge Plan and Services   Discharge Planning Services: CM Consult                                 Social Determinants of Health (SDOH) Interventions     Readmission Risk Interventions     No data to display

## 2021-12-25 NOTE — Progress Notes (Signed)
Spoke with CSW regarding patient asking for disability paperwork. Educated patient that this is done thru PCP.

## 2021-12-25 NOTE — Progress Notes (Signed)
CSW spoke to nurse about the Pt, the Pt needs PCP set up as she is being discharged. TOC will follow up with the Pt on Monday.

## 2021-12-25 NOTE — Progress Notes (Signed)
[facility] Gastroenterology Progress Note  Teresa Phillips 56 y.o. 23-Jul-1965   Subjective: Patient seen and examined lying in bed.  Patient reports no further episodes of hematochezia since yesterday.  Notes she is feeling improved today.  Denies abdominal pain nausea or vomiting.  ROS : Review of Systems  Gastrointestinal:  Negative for abdominal pain, blood in stool, constipation, diarrhea, heartburn, melena, nausea and vomiting.  Genitourinary:  Negative for dysuria and urgency.    Objective: Vital signs in last 24 hours: Vitals:   12/25/21 0227 12/25/21 0500  BP: 135/66 (!) 149/74  Pulse: 86 86  Resp: 18 18  Temp: 98.2 F (36.8 C) 98.3 F (36.8 C)  SpO2: 100% 100%    Physical Exam:  General:  Alert, cooperative, no distress, appears stated age  Head:  Normocephalic, without obvious abnormality, atraumatic  Eyes:  Anicteric sclera, EOM's intact  Lungs:   Clear to auscultation bilaterally, respirations unlabored  Heart:  Regular rate and rhythm, S1, S2 normal  Abdomen:   Soft, non-tender, bowel sounds active all four quadrants,  no masses,   Extremities: Extremities normal, atraumatic, no  edema  Pulses: 2+ and symmetric    Lab Results: Recent Labs    12/24/21 0506 12/25/21 0650  NA 142 142  K 3.5 3.7  CL 115* 115*  CO2 22 23  GLUCOSE 99 94  BUN 11 7  CREATININE 0.48 0.49  CALCIUM 8.7* 8.3*  MG 1.9 1.9  PHOS 3.0 3.2   Recent Labs    12/24/21 0506 12/25/21 0650  AST 16 15  ALT 12 10  ALKPHOS 79 64  BILITOT 0.4 0.5  PROT 6.9 5.7*  ALBUMIN 2.5* 2.1*   Recent Labs    12/24/21 0506 12/24/21 2320 12/25/21 0650  WBC 6.8 7.9 7.3  NEUTROABS 4.3  --  4.1  HGB 7.1* 6.9* 7.5*  HCT 22.0* 20.5* 22.6*  MCV 93.6 94.0 93.4  PLT 403* 343 321   No results for input(s): "LABPROT", "INR" in the last 72 hours.    Assessment Hematochezia Anemia   EGD 12/23/2021 - Widely patent Schatzki ring. - 1 cm hiatal hernia. - Non-bleeding gastric ulcers with a clean  ulcer base (Forrest Class III). Biopsied. - Erythematous duodenopathy.   Colonoscopy 12/23/2021 - Diverticulosis in the sigmoid colon, in the descending colon, in the transverse colon and in the ascending colon. - One 4 mm polyp in the ascending colon, removed with a cold biopsy forceps. Resected and retrieved. - Two 3 to 4 mm polyps in the rectum and in the sigmoid colon, removed with a cold biopsy forceps. Resected and retrieved.  Nuclear medicine GI bleeding scan 12/24/2021 No demonstratable active extravasation of tracer   4 units of packed red blood cells given total.  Overnight hemoglobin dropped to 6.9 from 7.1.  1 unit packed red blood cells given early this morning repeat hemoglobin 7.5.    Patient with recent colonoscopy and EGD 6/14.  Patient had 3 polyps removed during colonoscopy.  she also had notable diverticulosis throughout colon.  Patient began having moderate to large volume rectal bleeding yesterday morning, none noted today.  Possible diverticular bleeding.  Pressure and heart rate have been main stable.  Nuclear medicine GI bleeding scan negative.  Plan: Continue to monitor hemoglobin transfuse if less than 7 If patient begins actively bleeding again consider CT angio or repeat nuclear medicine GI bleeding scan Continue pain control as needed Continue Protonix 40 mg daily [facility] will follow.  Roseanne Reno Iktan Aikman  PA-C 12/25/2021, 11:01 AM  Contact #  [phone]

## 2021-12-26 LAB — CULTURE, BLOOD (ROUTINE X 2)
Culture: NO GROWTH
Culture: NO GROWTH
Special Requests: ADEQUATE

## 2021-12-26 LAB — BPAM RBC
Blood Product Expiration Date: 202307082359
Blood Product Expiration Date: 202307122359
Blood Product Expiration Date: 202307142359
ISSUE DATE / TIME: 202306131119
ISSUE DATE / TIME: 202306151657
ISSUE DATE / TIME: 202306160200
Unit Type and Rh: 5100
Unit Type and Rh: 5100
Unit Type and Rh: 5100

## 2021-12-26 LAB — TYPE AND SCREEN
ABO/RH(D): O POS
Antibody Screen: NEGATIVE
Unit division: 0
Unit division: 0
Unit division: 0

## 2021-12-27 ENCOUNTER — Encounter (HOSPITAL_COMMUNITY): Payer: Self-pay | Admitting: Gastroenterology

## 2021-12-28 ENCOUNTER — Telehealth: Payer: Self-pay

## 2021-12-28 NOTE — Telephone Encounter (Signed)
RNCM spoke with patient to advise of PCP appointment scheduled 01/04/22 at 1:45pm with Dr. Welton Flakes. RNCM advised patient to take ID, insurance card and medication list.   No additional TOC needs at this time.

## 2022-01-03 NOTE — Progress Notes (Signed)
  CC: Establish Care/HFU  HPI:  [name] is a 56 y.o. female with a past medical history stated below and presents today for hospital follow up and establish care. Please see problem based assessment and plan for additional details.  Past Medical Hx: Hypertension   Past Surgical Hx: No surgeries  Meds Protonix 40 mg daily Psyllium fiber packets   Allergies No known allergies  Family History  Problem Relation Age of Onset   Hypertension Mother    Breast cancer Mother    Asthma Father    COPD Paternal Grandmother   Breast Cancer in Mother Asthma in Father COPD in grandfather on father's side   Social History:  Shared house. 3 packs a week for 15 years. Rare alcohol use. No other drug use. Has one son that is 58. Works at [employer]. Able to do all ADLs and iADLs.    Review of Systems: ROS negative except for what is noted on the assessment and plan.  Vitals:   01/04/22 1320  BP: (!) 161/72  Pulse: 93  Resp: (!) 24  Temp: 98.5 F (36.9 C)  TempSrc: Oral  SpO2: 100%  Weight: 241 lb 4.8 oz (109.5 kg)  Height: 5\' 8"  (1.727 m)     Physical Exam: General: Well appearing obese female, NAD HENT: normocephalic, atraumatic EYES: conjunctiva non-erythematous, no scleral icterus CV: regular rate, normal rhythm, no murmurs, rubs, gallops. Pulmonary: normal work of breathing on RA, lungs clear to auscultation, no rales, wheezes, rhonchi Abdominal: non-distended, soft, non-tender to palpation, normal BS Skin: Warm and dry, right leg with slight swelling compared to the left side. Improved appearance compared to her presentation at the hospital.   Left lower extremity  Right lower extremity  Right lower extremity Neurological: MS: awake, alert and oriented x3, normal speech and fund of knowledge Motor: moves all extremities antigravity Psych: normal affect    Assessment & Plan:   See Encounters Tab for problem based charting.  Patient discussed with Dr.  , M.D. [facility] Internal Medicine, PGY-1 Pager: [phone]

## 2022-01-04 ENCOUNTER — Other Ambulatory Visit: Payer: Self-pay

## 2022-01-04 ENCOUNTER — Ambulatory Visit (INDEPENDENT_AMBULATORY_CARE_PROVIDER_SITE_OTHER): Payer: BC Managed Care – PPO | Admitting: Internal Medicine

## 2022-01-04 ENCOUNTER — Encounter: Payer: Self-pay | Admitting: Internal Medicine

## 2022-01-04 VITALS — BP 161/72 | HR 93 | Temp 98.5°F | Resp 24 | Ht 68.0 in | Wt 241.3 lb

## 2022-01-04 DIAGNOSIS — D62 Acute posthemorrhagic anemia: Secondary | ICD-10-CM

## 2022-01-04 DIAGNOSIS — R739 Hyperglycemia, unspecified: Secondary | ICD-10-CM

## 2022-01-04 DIAGNOSIS — E669 Obesity, unspecified: Secondary | ICD-10-CM | POA: Diagnosis not present

## 2022-01-04 DIAGNOSIS — D649 Anemia, unspecified: Secondary | ICD-10-CM

## 2022-01-04 DIAGNOSIS — I10 Essential (primary) hypertension: Secondary | ICD-10-CM

## 2022-01-04 DIAGNOSIS — Z6836 Body mass index (BMI) 36.0-36.9, adult: Secondary | ICD-10-CM

## 2022-01-04 DIAGNOSIS — Z13220 Encounter for screening for lipoid disorders: Secondary | ICD-10-CM

## 2022-01-04 DIAGNOSIS — L03115 Cellulitis of right lower limb: Secondary | ICD-10-CM

## 2022-01-04 DIAGNOSIS — Z87891 Personal history of nicotine dependence: Secondary | ICD-10-CM

## 2022-01-04 MED ORDER — AMLODIPINE BESYLATE 5 MG PO TABS: 5.0000 mg | ORAL_TABLET | Freq: Every day | ORAL | 11 refills | Status: DC

## 2022-01-04 NOTE — Patient Instructions (Addendum)
[name], it was a pleasure seeing you today! You endorsed feeling well today. Below are some of the things we talked about this visit. We look forward to seeing you in the follow up appointment!  Today we discussed: You presented for hospital follow-up and establish care.  You have completed your antibiotics and your leg is looking much better.  Since you have recently finished your antibiotics, the antibiotics work after completion.  Your pain is much better.  I will advise you to continue taking Tylenol up to 3 g as needed for pain. Your blood pressure was elevated and you previously were on amlodipine.  We will restart amlodipine today. We will repeat some of the lab work recommended by the hospital and will give the results after we get them back.   I have ordered the following labs today:  Lab Orders         CBC with Diff         CMP14 + Anion Gap         Magnesium         Hemoglobin A1c         Lipid Profile       Referrals ordered today:   Referral Orders  No referral(s) requested today     I have ordered the following medication/changed the following medications:   Stop the following medications: There are no discontinued medications.   Start the following medications: Meds ordered this encounter  Medications   amLODipine (NORVASC) 5 MG tablet    Sig: Take 1 tablet (5 mg total) by mouth daily.    Dispense:  30 tablet    Refill:  11     Follow-up: 3-4 month follow up for HTN  Please make sure to arrive 15 minutes prior to your next appointment. If you arrive late, you may be asked to reschedule.   We look forward to seeing you next time. Please call our clinic at [phone] if you have any questions or concerns. The best time to call is Monday-Friday from 9am-4pm, but there is someone available 24/7. If after hours or the weekend, call the main hospital number and ask for the Internal Medicine Resident On-Call. If you need medication refills, please notify  your pharmacy one week in advance and they will send [place] a request.  Thank you for letting us take part in your care. Wishing you the best!  Thank you, Gwenevere Abbot, MD

## 2022-01-05 LAB — HEMOGLOBIN A1C
Est. average glucose Bld gHb Est-mCnc: 103 mg/dL
Hgb A1c MFr Bld: 5.2 % (ref 4.8–5.6)

## 2022-01-05 LAB — CBC WITH DIFFERENTIAL/PLATELET
Basophils Absolute: 0.1 10*3/uL (ref 0.0–0.2)
Basos: 1 %
EOS (ABSOLUTE): 0.3 10*3/uL (ref 0.0–0.4)
Eos: 3 %
Hematocrit: 26.7 % — ABNORMAL LOW (ref 34.0–46.6)
Hemoglobin: 8.6 g/dL — ABNORMAL LOW (ref 11.1–15.9)
Immature Grans (Abs): 0 10*3/uL (ref 0.0–0.1)
Immature Granulocytes: 0 %
Lymphocytes Absolute: 1.6 10*3/uL (ref 0.7–3.1)
Lymphs: 19 %
MCH: 29.7 pg (ref 26.6–33.0)
MCHC: 32.2 g/dL (ref 31.5–35.7)
MCV: 92 fL (ref 79–97)
Monocytes Absolute: 0.6 10*3/uL (ref 0.1–0.9)
Monocytes: 7 %
Neutrophils Absolute: 6 10*3/uL (ref 1.4–7.0)
Neutrophils: 70 %
Platelets: 625 10*3/uL — ABNORMAL HIGH (ref 150–450)
RBC: 2.9 x10E6/uL — ABNORMAL LOW (ref 3.77–5.28)
RDW: 13.4 % (ref 11.7–15.4)
WBC: 8.7 10*3/uL (ref 3.4–10.8)

## 2022-01-05 LAB — CMP14 + ANION GAP
ALT: 10 IU/L (ref 0–32)
AST: 18 IU/L (ref 0–40)
Albumin/Globulin Ratio: 1 — ABNORMAL LOW (ref 1.2–2.2)
Albumin: 3.7 g/dL — ABNORMAL LOW (ref 3.8–4.9)
Alkaline Phosphatase: 110 IU/L (ref 44–121)
Anion Gap: 14 mmol/L (ref 10.0–18.0)
BUN/Creatinine Ratio: 11 (ref 9–23)
BUN: 7 mg/dL (ref 6–24)
Bilirubin Total: <0.2 mg/dL (ref 0.0–1.2)
CO2: 22 mmol/L (ref 20–29)
Calcium: 9.3 mg/dL (ref 8.7–10.2)
Chloride: 105 mmol/L (ref 96–106)
Creatinine, Ser: 0.65 mg/dL (ref 0.57–1.00)
Globulin, Total: 3.7 g/dL (ref 1.5–4.5)
Glucose: 86 mg/dL (ref 70–99)
Potassium: 4.3 mmol/L (ref 3.5–5.2)
Sodium: 141 mmol/L (ref 134–144)
Total Protein: 7.4 g/dL (ref 6.0–8.5)
eGFR: 102 mL/min/{1.73_m2} (ref >59)

## 2022-01-05 LAB — LIPID PANEL
Chol/HDL Ratio: 3.8 ratio (ref 0.0–4.4)
Cholesterol, Total: 213 mg/dL — ABNORMAL HIGH (ref 100–199)
HDL: 56 mg/dL (ref >39)
LDL Chol Calc (NIH): 139 mg/dL — ABNORMAL HIGH (ref 0–99)
Triglycerides: 102 mg/dL (ref 0–149)
VLDL Cholesterol Cal: 18 mg/dL (ref 5–40)

## 2022-01-05 LAB — MAGNESIUM: Magnesium: 1.8 mg/dL (ref 1.6–2.3)

## 2022-01-05 MED ORDER — ROSUVASTATIN CALCIUM 20 MG PO TABS: 20.0000 mg | ORAL_TABLET | Freq: Every day | ORAL | 3 refills | Status: AC

## 2022-01-06 ENCOUNTER — Encounter: Payer: Self-pay | Admitting: Internal Medicine

## 2022-01-06 DIAGNOSIS — E66812 Obesity, class 2: Secondary | ICD-10-CM | POA: Insufficient documentation

## 2022-01-06 DIAGNOSIS — E669 Obesity, unspecified: Secondary | ICD-10-CM | POA: Insufficient documentation

## 2022-01-06 MED ORDER — FERROUS SULFATE 325 (65 FE) MG PO TABS: 325.0000 mg | ORAL_TABLET | Freq: Every day | ORAL | 0 refills | Status: AC

## 2022-01-06 NOTE — Assessment & Plan Note (Signed)
Patient has hx of HTN but is not on any medications. She was previously on amlodipine and we will restart amlodipine 5 mg this visit. We will follow up in 3-4 weeks to re-evaluate her HTN and make medication adjustments. Plan to follow up on electrolyte derangements from inpatient stay.  -CMP this visit -Mag this visit -Restart amlodipine 5 mg qd  Addendum: Electrolytes wnl. Albumin improving. Renal fxn wnl.

## 2022-01-06 NOTE — Assessment & Plan Note (Addendum)
Patient was seen by GI during her hospital stay for ABLA. She received 4 units of pRBCs. GI recommended follow up outpatient. Patient unaware of follow up. I reached out to [facility] and scheduled for a follow up on 06/28 at 9 am. CBC obtained this visit and her hgb trending upwards from 8.1 to 8.6. Iron deficit calculated at 1156 mg. Since her hgb is improving, I advised her to supplement with oral iron 325 mg daily. Thrombocytosis most likely secondary to anemia.  -Start oral iron replacement therapy with 325 mg iron.  -Follow up with GI -Avoid NSAIDs given EGD findings, Continue Protonix daily -Will repeat CBC at subsequent visit

## 2022-01-06 NOTE — Assessment & Plan Note (Signed)
Patient had cellulitis of the right leg and she recently discharged from the hospital for this reason. Patient originally presented to urgent care and was given doxycycline which did not improve her cellulitis and then presented to the ED and received Cefepime and Ceftriaxone which was changed to PO Cefdinir on discharge for 6 days. She has completed her antibiotics without missing any dosage. Pain improved to 3/10 and appearance improved and close to baseline.  -Advised to take tylenol as needed for pain -Patient stable to return to work. I will fill out short term disability for his missed time due to acute illness -Follow up in 3 weeks on this along to ensure complete resolution with HTN follow up.

## 2022-01-11 NOTE — Progress Notes (Signed)
Internal Medicine Clinic Attending  Case discussed with the resident at the time of the visit.  We reviewed the resident's history and exam and pertinent patient test results.  I agree with the assessment, diagnosis, and plan of care documented in the resident's note.  

## 2022-01-13 ENCOUNTER — Emergency Department (HOSPITAL_COMMUNITY)
Admission: EM | Admit: 2022-01-13 | Discharge: 2022-01-13 | Disposition: A | Discharge status: Home / Self Care | Payer: BC Managed Care – PPO | Attending: Emergency Medicine | Admitting: Emergency Medicine

## 2022-01-13 ENCOUNTER — Emergency Department (HOSPITAL_BASED_OUTPATIENT_CLINIC_OR_DEPARTMENT_OTHER): Payer: BC Managed Care – PPO

## 2022-01-13 ENCOUNTER — Encounter (HOSPITAL_COMMUNITY): Payer: Self-pay | Admitting: Emergency Medicine

## 2022-01-13 DIAGNOSIS — R600 Localized edema: Secondary | ICD-10-CM | POA: Diagnosis not present

## 2022-01-13 DIAGNOSIS — M79609 Pain in unspecified limb: Secondary | ICD-10-CM | POA: Diagnosis not present

## 2022-01-13 DIAGNOSIS — M7989 Other specified soft tissue disorders: Secondary | ICD-10-CM

## 2022-01-13 DIAGNOSIS — M79604 Pain in right leg: Secondary | ICD-10-CM | POA: Insufficient documentation

## 2022-01-13 LAB — CBC WITH DIFFERENTIAL/PLATELET
Abs Immature Granulocytes: 0.01 10*3/uL (ref 0.00–0.07)
Basophils Absolute: 0.1 10*3/uL (ref 0.0–0.1)
Basophils Relative: 1 %
Eosinophils Absolute: 0.3 10*3/uL (ref 0.0–0.5)
Eosinophils Relative: 4 %
HCT: 31.9 % — ABNORMAL LOW (ref 36.0–46.0)
Hemoglobin: 9.7 g/dL — ABNORMAL LOW (ref 12.0–15.0)
Immature Granulocytes: 0 %
Lymphocytes Relative: 19 %
Lymphs Abs: 1.3 10*3/uL (ref 0.7–4.0)
MCH: 29 pg (ref 26.0–34.0)
MCHC: 30.4 g/dL (ref 30.0–36.0)
MCV: 95.2 fL (ref 80.0–100.0)
Monocytes Absolute: 0.5 10*3/uL (ref 0.1–1.0)
Monocytes Relative: 8 %
Neutro Abs: 4.6 10*3/uL (ref 1.7–7.7)
Neutrophils Relative %: 68 %
Platelets: 490 10*3/uL — ABNORMAL HIGH (ref 150–400)
RBC: 3.35 MIL/uL — ABNORMAL LOW (ref 3.87–5.11)
RDW: 14.5 % (ref 11.5–15.5)
WBC: 6.7 10*3/uL (ref 4.0–10.5)
nRBC: 0 % (ref 0.0–0.2)

## 2022-01-13 LAB — BASIC METABOLIC PANEL
Anion gap: 7 (ref 5–15)
BUN: 10 mg/dL (ref 6–20)
CO2: 23 mmol/L (ref 22–32)
Calcium: 9.4 mg/dL (ref 8.9–10.3)
Chloride: 112 mmol/L — ABNORMAL HIGH (ref 98–111)
Creatinine, Ser: 0.59 mg/dL (ref 0.44–1.00)
GFR, Estimated: >60 mL/min (ref >60)
Glucose, Bld: 97 mg/dL (ref 70–99)
Potassium: 3.4 mmol/L — ABNORMAL LOW (ref 3.5–5.1)
Sodium: 142 mmol/L (ref 135–145)

## 2022-01-13 NOTE — Discharge Instructions (Signed)
Return for any problem.  Elevate leg as instructed.  If you develop worsening pain, fever, other concerning symptom please return to the ED or seek medical attention.

## 2022-01-13 NOTE — ED Provider Triage Note (Signed)
Emergency Medicine Provider Triage Evaluation Note  Teresa Phillips , a 56 y.o. female  was evaluated in triage.  Pt complains of right lower extremity pain and swelling.  This been ongoing for a month.  Patient was discharged on 12/25/2021 after a week stay for cellulitis over the right lower extremity.  She was not discharged on antibiotics.  Since then she still been having mild pain and firmness to the leg.  No fevers, chills, chest pain, shortness of breath.  Review of Systems  Positive:  Negative: See above   Physical Exam  BP (!) 206/114 (BP Location: Left Arm)   Pulse 88   Temp 98.2 F (36.8 C) (Oral)   Resp 18   SpO2 99%  Gen:   Awake, no distress   Resp:  Normal effort  MSK:   Moves extremities without difficulty  Other:  Positive Homans' sign on the right.  There is some firmness and tenderness to the right lower extremity.  Compartments are soft though.  Medical Decision Making  Medically screening exam initiated at 12:40 PM.  Appropriate orders placed.  Teresa Phillips was informed that the remainder of the evaluation will be completed by another provider, this initial triage assessment does not replace that evaluation, and the importance of remaining in the ED until their evaluation is complete.    Honor Loh [place], [place] 01/13/22 1242

## 2022-01-13 NOTE — Progress Notes (Signed)
RLE venous duplex has been completed.  Preliminary results given to Dr. Rodena Medin.   Results can be found under chart review under CV PROC. 01/13/2022 3:54 PM Odella Appelhans RVT, RDMS

## 2022-01-13 NOTE — ED Triage Notes (Signed)
Patient recent admission for cellulitis of R leg. Concerned for ongoing infection and states she wanted to make sure it is healing.

## 2022-01-13 NOTE — ED Provider Notes (Signed)
[facility]-EMERGENCY DEPT Provider Note   CSN: [id number] Arrival date & time: 01/13/22  1219     History  Chief Complaint  Patient presents with   Leg Pain    Teresa Phillips is a 56 y.o. female.  56 year old female with prior medical history as detailed below presents for evaluation.  Patient reports persistent right lower extremity swelling.  Patient was admitted last month for cellulitis of the right lower extremity.  Patient reports that her infection improved significantly.  However, she complains of continued swelling to the right leg.  She denies recent fever.  She is concerned about recurrent infection and/or blood clot.  The history is provided by the patient and medical records.  Leg Pain Location:  Leg Leg location:  R leg and R lower leg Pain details:    Quality:  Aching   Radiates to:  Does not radiate   Severity:  Mild   Onset quality:  Gradual      Home Medications Prior to Admission medications   Medication Sig Start Date End Date Taking? Authorizing Provider  acetaminophen (TYLENOL) 325 MG tablet Take 2 tablets (650 mg total) by mouth every 6 (six) hours as needed for mild pain (or Fever >/= 101). 12/25/21   Marguerita Merles Latif, DO  amLODipine (NORVASC) 5 MG tablet Take 1 tablet (5 mg total) by mouth daily. 01/04/22 01/04/23  Gwenevere Abbot, MD  ferrous sulfate 325 (65 FE) MG tablet Take 1 tablet (325 mg total) by mouth daily. 01/06/22 04/06/22  Gwenevere Abbot, MD  Multiple Vitamin (MULTIVITAMIN WITH MINERALS) TABS tablet Take 1 tablet by mouth daily. 12/26/21   Sheikh, Kateri Mc Latif, DO  nicotine (NICODERM CQ - DOSED IN MG/24 HOURS) 14 mg/24hr patch Place 1 patch (14 mg total) onto the skin daily. 12/26/21   Marguerita Merles Latif, DO  nutrition supplement, JUVEN, (JUVEN) PACK Take 1 packet by mouth 2 (two) times daily between meals. 12/26/21   Marguerita Merles Latif, DO  pantoprazole (PROTONIX) 40 MG tablet Take 1 tablet (40 mg total) by mouth daily. 12/25/21    Marguerita Merles Latif, DO  psyllium (HYDROCIL/METAMUCIL) 95 % PACK Take 1 packet by mouth daily. 12/26/21   Marguerita Merles Latif, DO  rosuvastatin (CRESTOR) 20 MG tablet Take 1 tablet (20 mg total) by mouth daily. 01/05/22 01/05/23  Gwenevere Abbot, MD      Allergies    Avocado and Shellfish allergy    Review of Systems   Review of Systems  All other systems reviewed and are negative.   Physical Exam Updated Vital Signs BP (!) 193/115 (BP Location: Left Arm)   Pulse 75   Temp 98.2 F (36.8 C) (Oral)   Resp 18   SpO2 97%  Physical Exam Vitals and nursing note reviewed.  Constitutional:      General: She is not in acute distress.    Appearance: Normal appearance. She is well-developed.  HENT:     Head: Normocephalic and atraumatic.  Eyes:     Conjunctiva/sclera: Conjunctivae normal.     Pupils: Pupils are equal, round, and reactive to light.  Cardiovascular:     Rate and Rhythm: Normal rate and regular rhythm.     Heart sounds: Normal heart sounds.  Pulmonary:     Effort: Pulmonary effort is normal. No respiratory distress.     Breath sounds: Normal breath sounds.  Abdominal:     General: There is no distension.     Palpations: Abdomen is soft.  Tenderness: There is no abdominal tenderness.  Musculoskeletal:        General: Swelling present. No deformity. Normal range of motion.     Cervical back: Normal range of motion and neck supple.     Comments: Persistent mild edema to the right lower extremity.  See images below.  Skin:    General: Skin is warm and dry.  Neurological:     General: No focal deficit present.     Mental Status: She is alert and oriented to person, place, and time.          ED Results / Procedures / Treatments   Labs (all labs ordered are listed, but only abnormal results are displayed) Labs Reviewed  CBC WITH DIFFERENTIAL/PLATELET - Abnormal; Notable for the following components:      Result Value   RBC 3.35 (*)    Hemoglobin 9.7 (*)     HCT 31.9 (*)    Platelets 490 (*)    All other components within normal limits  BASIC METABOLIC PANEL - Abnormal; Notable for the following components:   Potassium 3.4 (*)    Chloride 112 (*)    All other components within normal limits    EKG None  Radiology No results found.  Procedures Procedures    Medications Ordered in ED Medications - No data to display  ED Course/ Medical Decision Making/ A&P                           Medical Decision Making   Medical Screen Complete  This patient presented to the ED with complaint of persistent right leg pain and swelling.  This complaint involves an extensive number of treatment options. The initial differential diagnosis includes, but is not limited to, DVT, recurrent infection, etc.  This presentation is: Acute, Chronic, Self-Limited, Previously Undiagnosed, Uncertain Prognosis, Complicated, Systemic Symptoms, and Threat to Life/Bodily Function  Patient with recently treated right lower extremity cellulitis that required IV antibiotics and inpatient treatment.  Patient presents now with complaint of continued persistent edema to the right lower extremity.  Patient without fever.  Patient without indication of active infection on exam.  Notably, patient's Prabhakar count is 6.7.  Hemoglobin is 9.7.  No evidence of DVT seen on ultrasound today.  Patient is reassured by these findings.  She does understand need for close outpatient follow-up.  Strict return precautions given and understood.    Additional history obtained:  External records from outside sources obtained and reviewed including prior ED visits and prior Inpatient records.    Lab Tests:  I ordered and personally interpreted labs.  The pertinent results include: CBC, BMP   Imaging Studies ordered:  I ordered imaging studies including [place] of RLE  I independently visualized and interpreted obtained imaging which showed no dvt I agree with the radiologist  interpretation.   Cardiac Monitoring:  The patient was maintained on a cardiac monitor.  I personally viewed and interpreted the cardiac monitor which showed an underlying rhythm of: NSR   Problem List / ED Course:  Right lower extremity edema   Reevaluation:  After the interventions noted above, I reevaluated the patient and found that they have: improved  Disposition:  After consideration of the diagnostic results and the patients response to treatment, I feel that the patent would benefit from close outpatient followup.          Final Clinical Impression(s) / ED Diagnoses Final diagnoses:  Right leg pain  Rx / DC Orders ED Discharge Orders     None         Valarie Merino, MD 01/13/22 [phone]

## 2022-01-25 ENCOUNTER — Encounter: Payer: Self-pay | Admitting: Student

## 2022-01-25 NOTE — Progress Notes (Signed)
Patient is here for a return to work letter.  Patient was admitted in June for right lower leg cellulitis.  She saw [place] in the office on 6/26 with improvement of her symptoms.  Today she states that her pain has almost resolved.  She was able to ambulate from the front entrance down to the clinic without any difficulty.  She still has trace to +1 edema which she is wearing compression socks.  States that she is ready to go back to work and her job does not required much ambulation.

## 2022-03-05 ENCOUNTER — Other Ambulatory Visit: Payer: Self-pay | Admitting: Internal Medicine

## 2022-03-05 DIAGNOSIS — I10 Essential (primary) hypertension: Secondary | ICD-10-CM

## 2022-03-07 NOTE — Progress Notes (Deleted)
   CC: follow up  HPI:  [name] is a 56 y.o. with medical history of HTN, HLD, chronic anemia, hx of cellulitis of right leg presenting to [facility] for follow up.   Please see problem-based list for further details, assessments, and plans.  Past Medical History:  Diagnosis Date   Cellulitis of right leg 12/10/2021   Hypertension    Hypokalemia 12/20/2021     Current Outpatient Medications (Cardiovascular):    amLODipine (NORVASC) 5 MG tablet, Take 1 tablet (5 mg total) by mouth daily.   rosuvastatin (CRESTOR) 20 MG tablet, Take 1 tablet (20 mg total) by mouth daily.   Current Outpatient Medications (Analgesics):    acetaminophen (TYLENOL) 325 MG tablet, Take 2 tablets (650 mg total) by mouth every 6 (six) hours as needed for mild pain (or Fever >/= 101).  Current Outpatient Medications (Hematological):    ferrous sulfate 325 (65 FE) MG tablet, Take 1 tablet (325 mg total) by mouth daily.  Current Outpatient Medications (Other):    Multiple Vitamin (MULTIVITAMIN WITH MINERALS) TABS tablet, Take 1 tablet by mouth daily.   nicotine (NICODERM CQ - DOSED IN MG/24 HOURS) 14 mg/24hr patch, Place 1 patch (14 mg total) onto the skin daily.   nutrition supplement, JUVEN, (JUVEN) PACK, Take 1 packet by mouth 2 (two) times daily between meals.   pantoprazole (PROTONIX) 40 MG tablet, Take 1 tablet (40 mg total) by mouth daily.   psyllium (HYDROCIL/METAMUCIL) 95 % PACK, Take 1 packet by mouth daily.  Review of Systems:  Review of system negative unless stated in the problem list or HPI.    Physical Exam:  There were no vitals filed for this visit.  Physical Exam General: NAD HENT: NCAT Lungs: CTAB, no wheeze, rhonchi or rales.  Cardiovascular: Normal heart sounds, no r/m/g, 2+ pulses in all extremities. No LE edema Abdomen: No TTP, normal bowel sounds MSK: No asymmetry or muscle atrophy.  Skin: no lesions noted on exposed skin Neuro: Alert and oriented x4. CN grossly  intact Psych: Normal mood and normal affect   Assessment & Plan:   No problem-specific Assessment & Plan notes found for this encounter.   See Encounters Tab for problem based charting.  Patient discussed with Dr. {NAMES:[id number]::"Guilloud","Hoffman","Mullen","Narendra","Vincent","Machen","Lau","Hatcher"} Gwenevere Abbot, MD Eligha Bridegroom. [facility] Internal Medicine Residency, PGY-2

## 2022-03-08 ENCOUNTER — Encounter: Payer: BC Managed Care – PPO | Admitting: Internal Medicine

## 2022-03-09 ENCOUNTER — Encounter: Payer: Self-pay | Admitting: Internal Medicine

## 2022-03-22 ENCOUNTER — Other Ambulatory Visit: Payer: Self-pay | Admitting: Internal Medicine

## 2022-03-22 DIAGNOSIS — I10 Essential (primary) hypertension: Secondary | ICD-10-CM

## 2023-12-21 IMAGING — CR DG CHEST 2V
2 series · 2 of 2 positions shown · non-contrast
Comparison: 01/15/2019

CLINICAL DATA: Swelling, pain in right leg.

EXAM:
CHEST - 2 VIEW

[x chest ap]
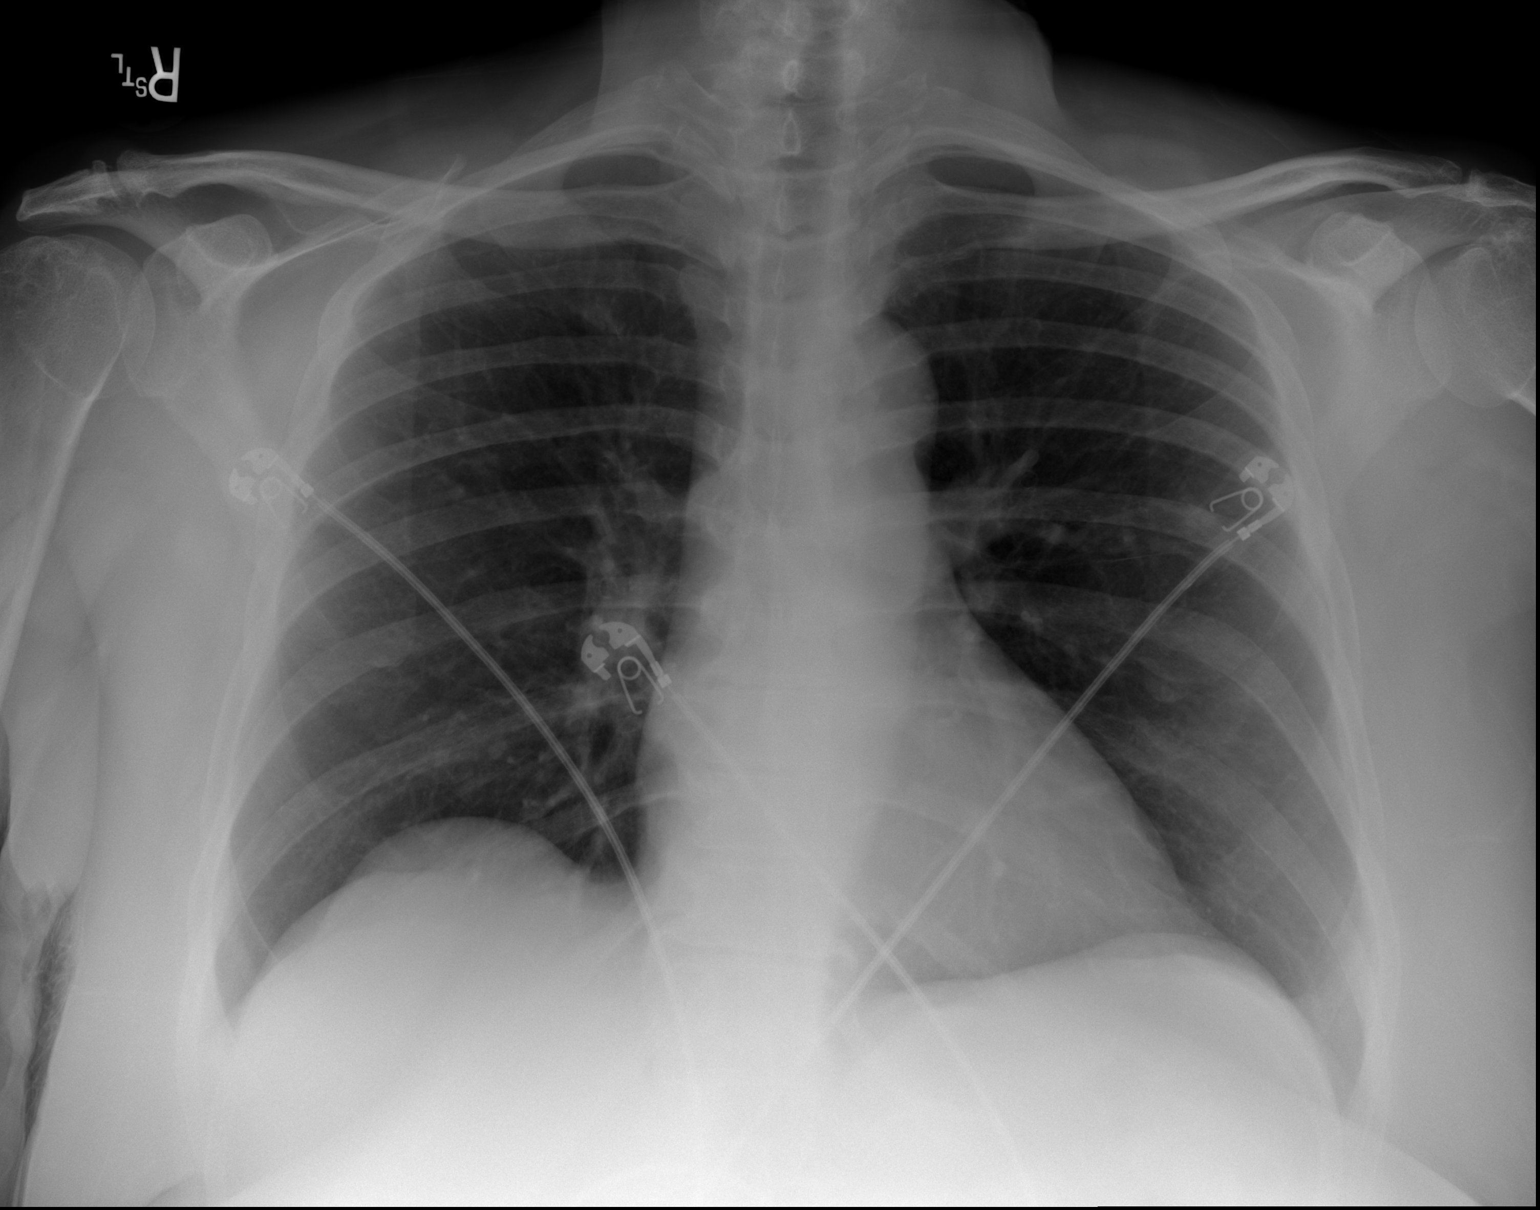

[w chest lat]
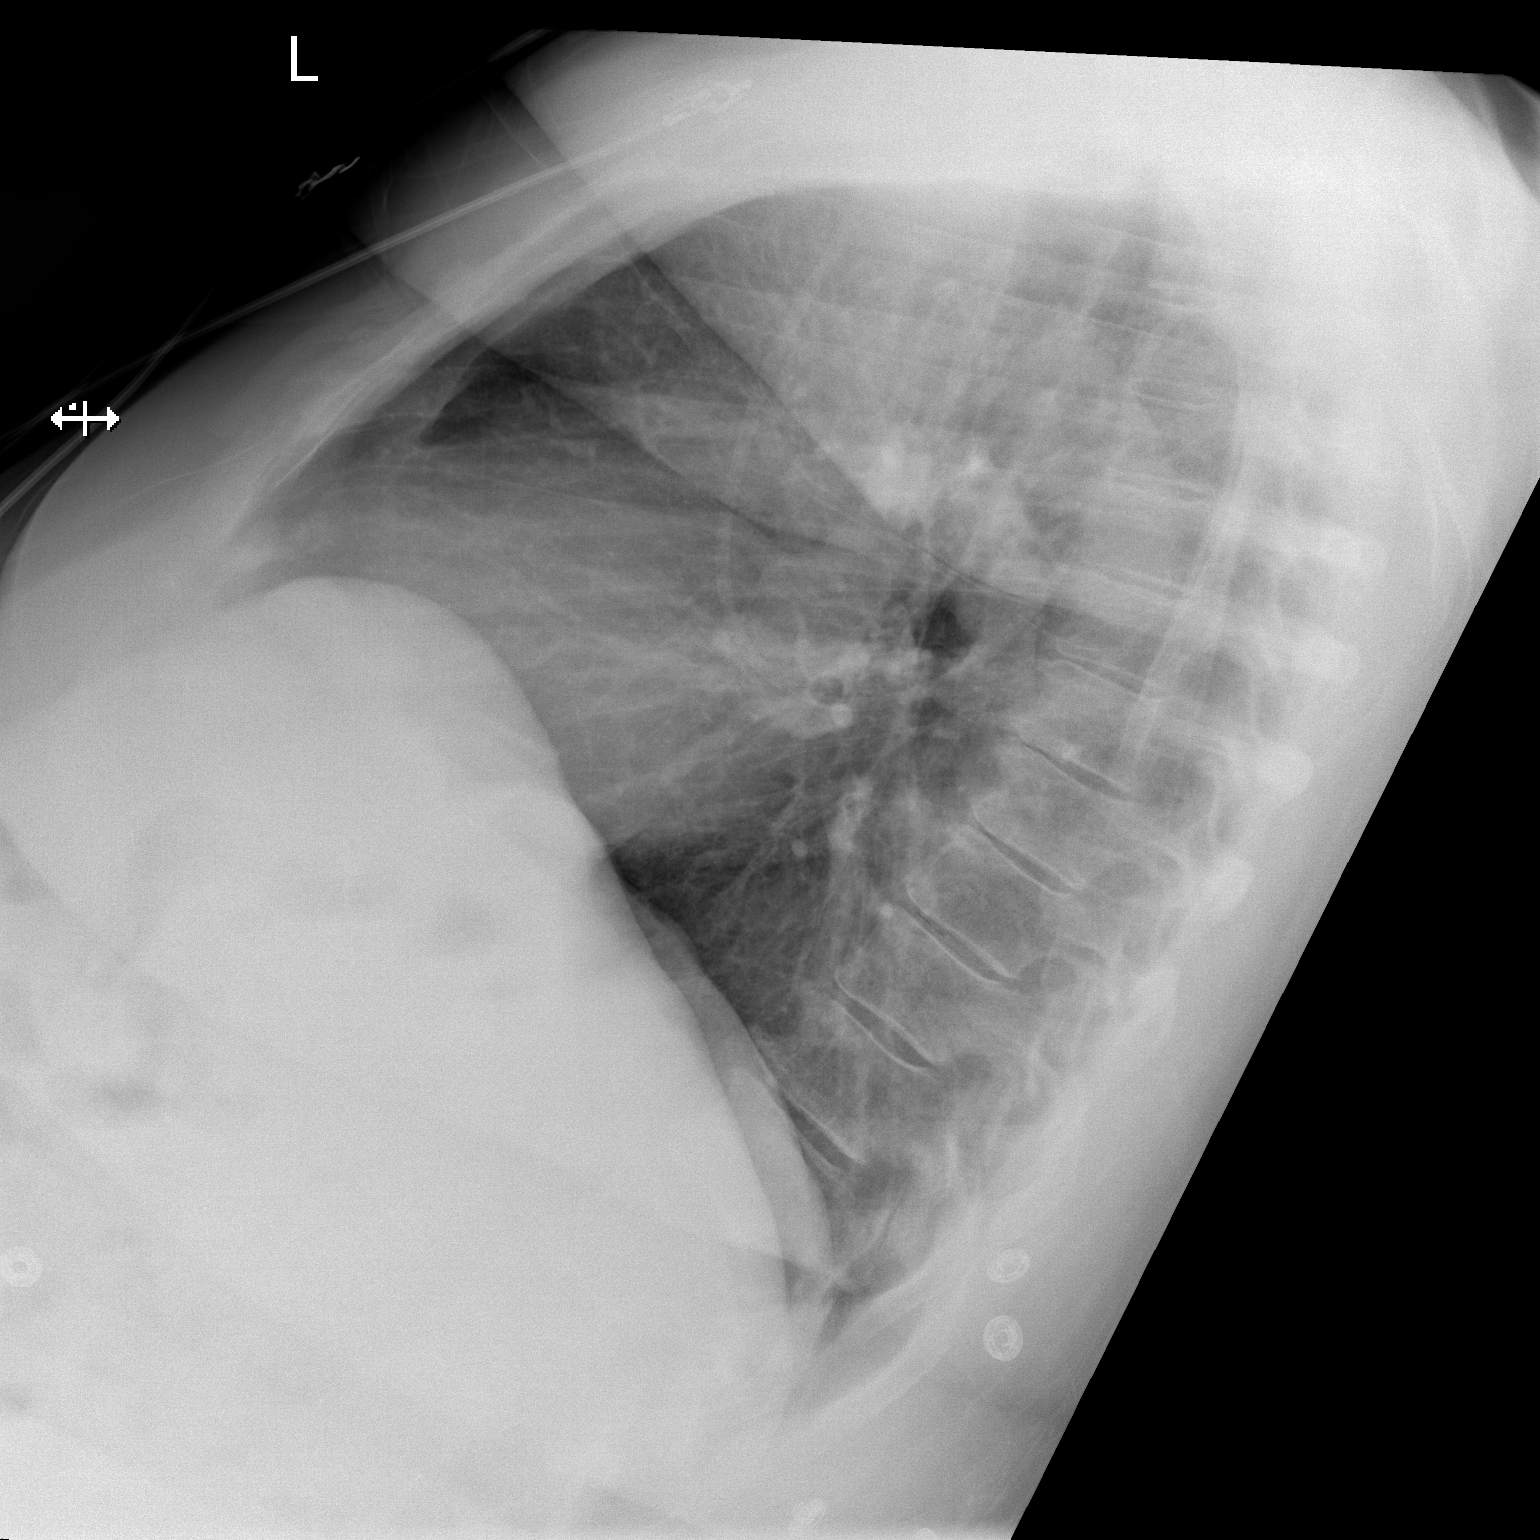

[2 of 2 positions shown; findings below may reference images not displayed]

FINDINGS: The heart size and mediastinal contours are within normal limits.
Both lungs are clear. The visualized skeletal structures are
unremarkable.
IMPRESSION: Normal study.

## 2023-12-21 IMAGING — CR DG TIBIA/FIBULA 2V*R*
4 series · 4 of 4 positions shown · non-contrast
Comparison: None Available.

CLINICAL DATA: Right lower extremity pain and swelling.

EXAM:
RIGHT TIBIA AND FIBULA - 2 VIEW; RIGHT FOOT - 2 VIEW

[x tib-fib ap right]
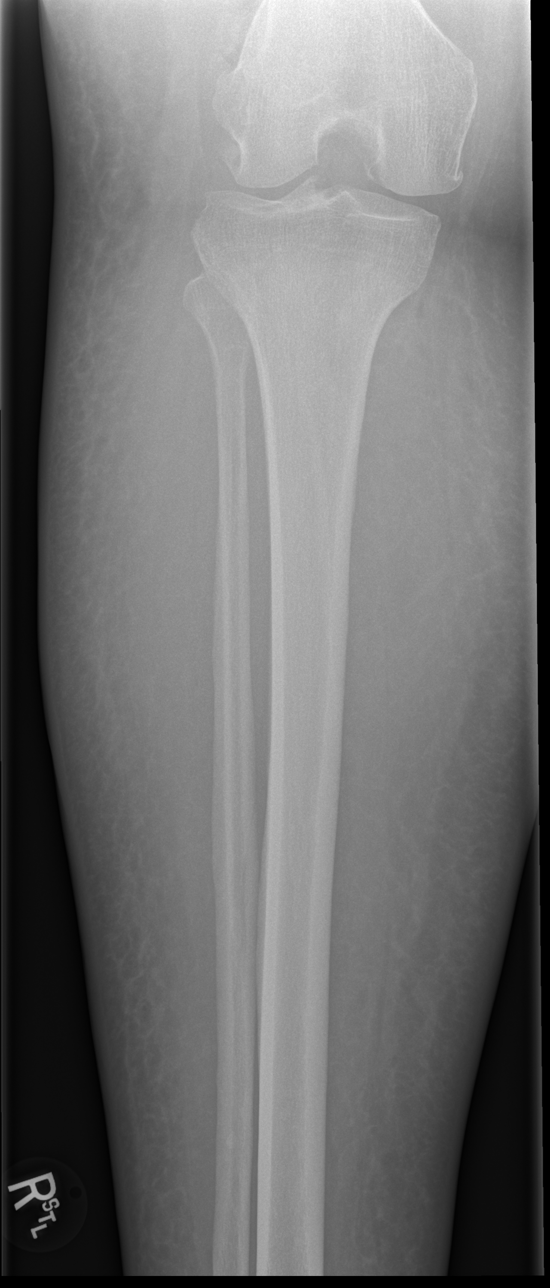

[x tib-fib lat right (1 of 3)]
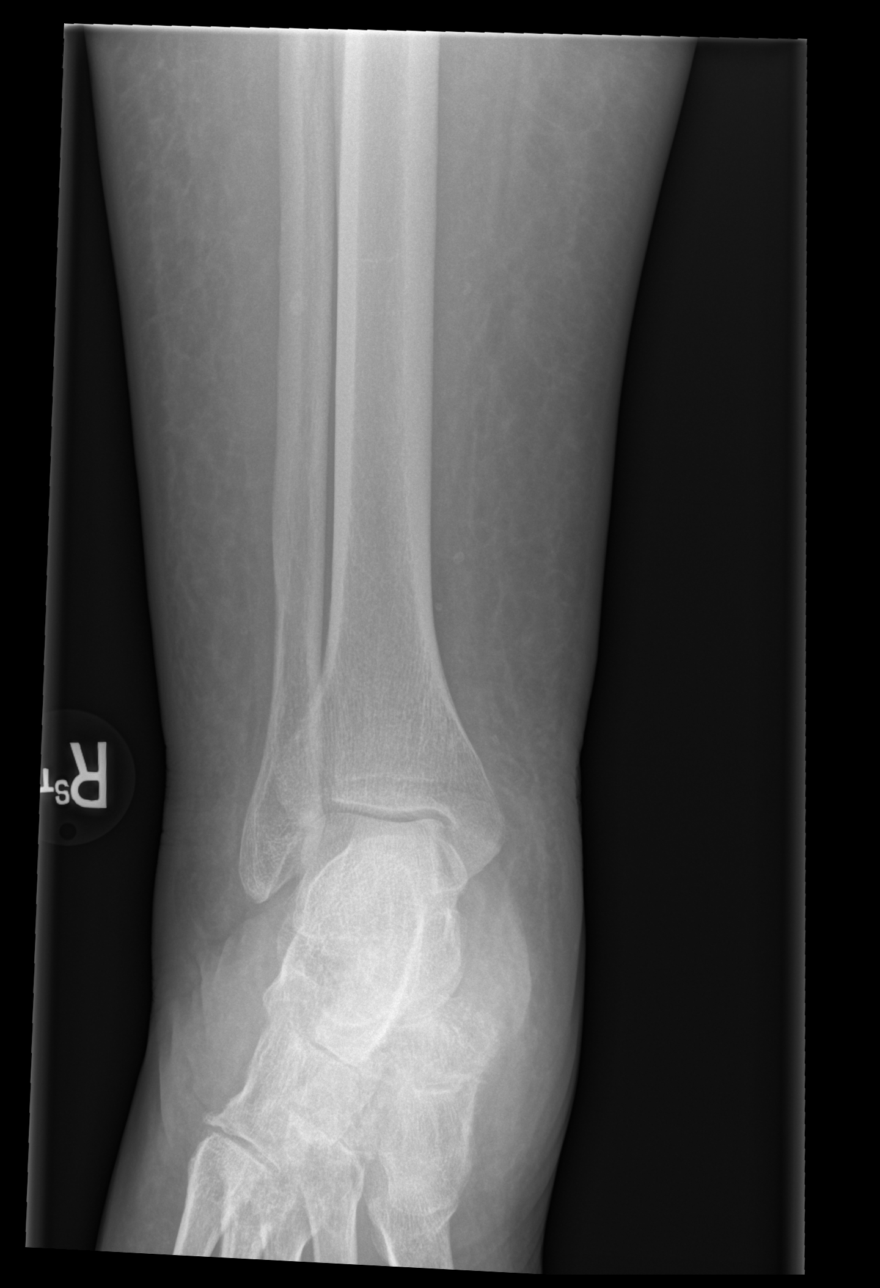

[x tib-fib lat right (2 of 3)]
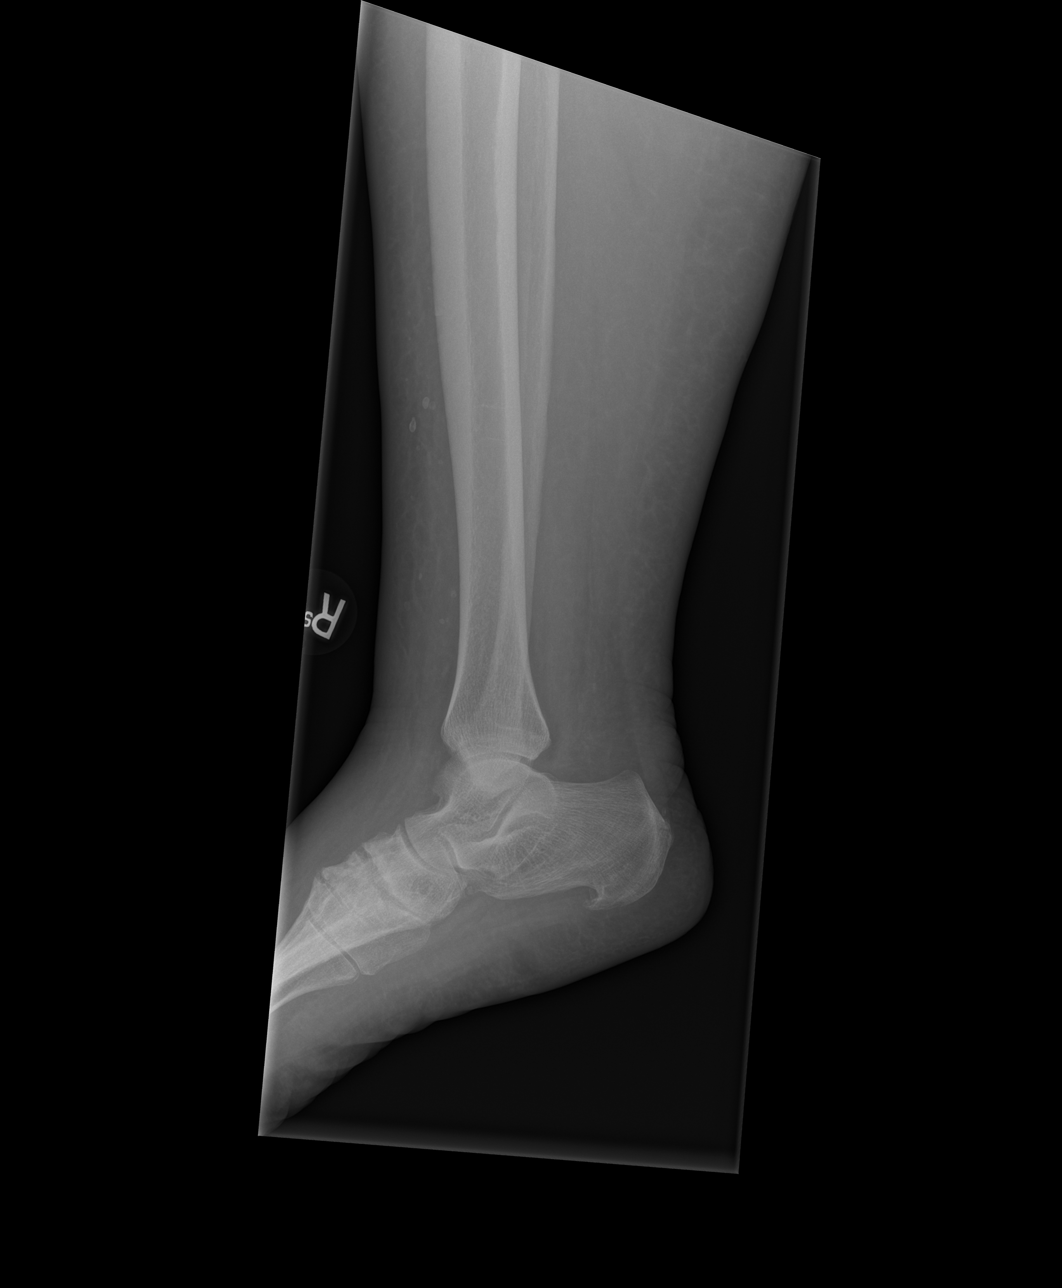

[x tib-fib lat right (3 of 3)]
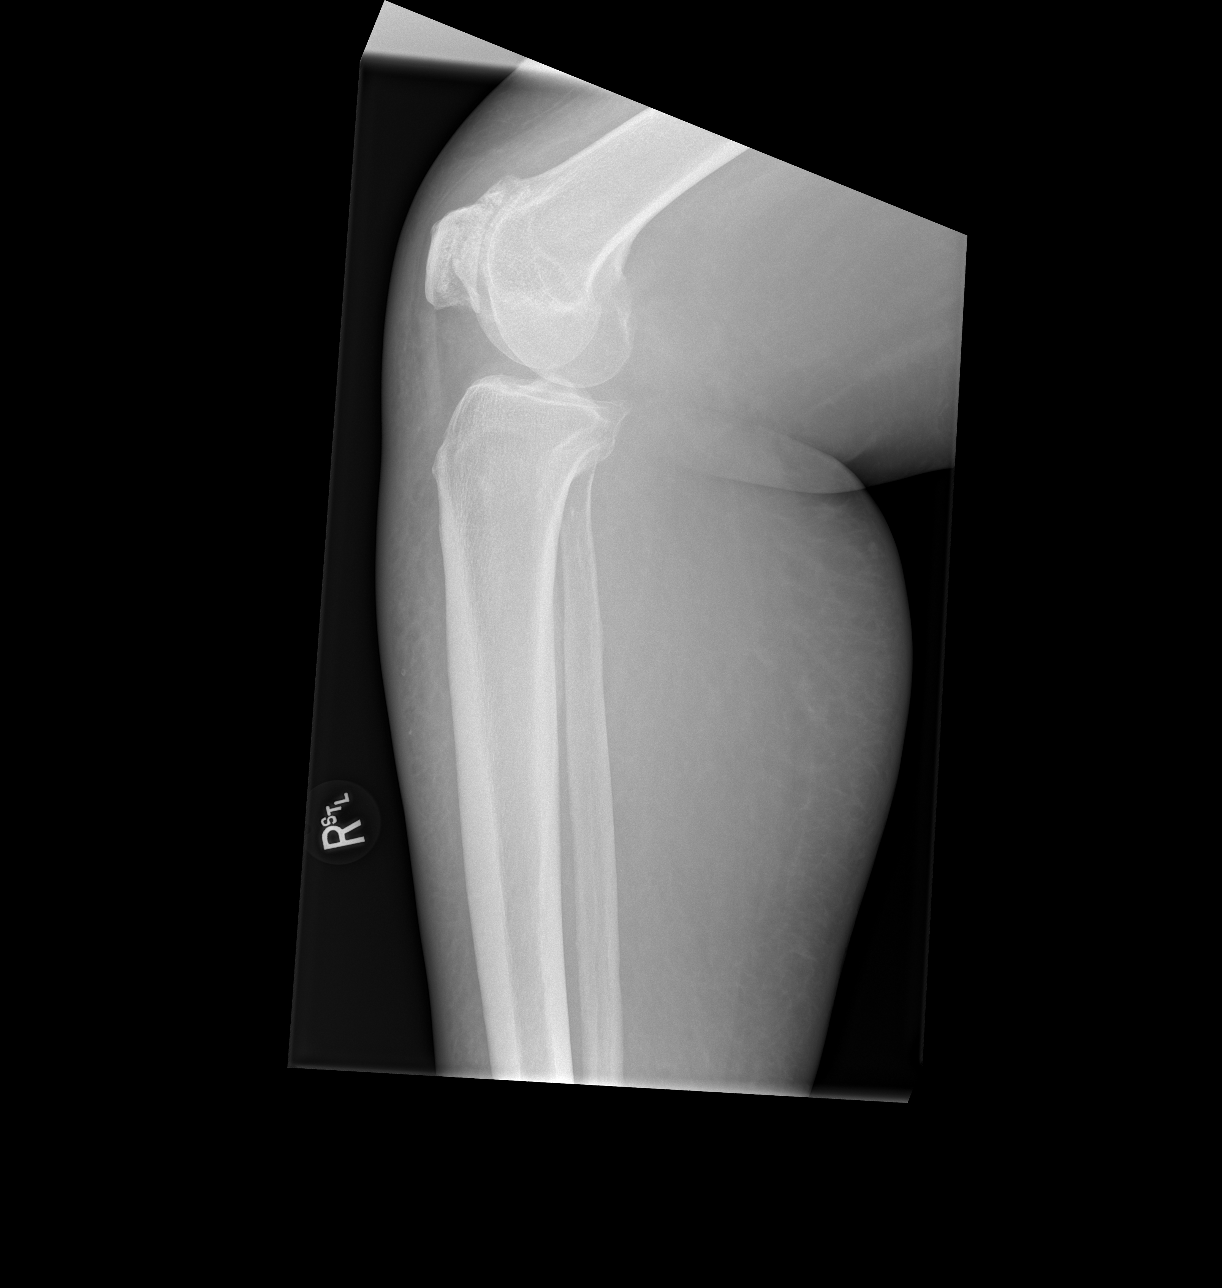

[4 of 4 positions shown; findings below may reference images not displayed]

FINDINGS: There is no acute fracture or dislocation. Mild arthritic changes of
the right knee and degenerative changes of the tarsal joints with
irregularity and spurring. There is degenerative changes of the
first MTP joint with joint space narrowing and spurring. There is
diffuse subcutaneous edema and soft tissue swelling of the dorsum of
the foot. No radiopaque foreign object or soft tissue gas.
IMPRESSION: 1. No acute fracture or dislocation.
2. Diffuse subcutaneous edema and soft tissue swelling of the dorsum
of the foot.

## 2023-12-21 IMAGING — CR DG FOOT 2V*R*
2 series · 2 of 2 positions shown · non-contrast
Comparison: None Available.

CLINICAL DATA: Right lower extremity pain and swelling.

EXAM:
RIGHT TIBIA AND FIBULA - 2 VIEW; RIGHT FOOT - 2 VIEW

[x foot lat right]
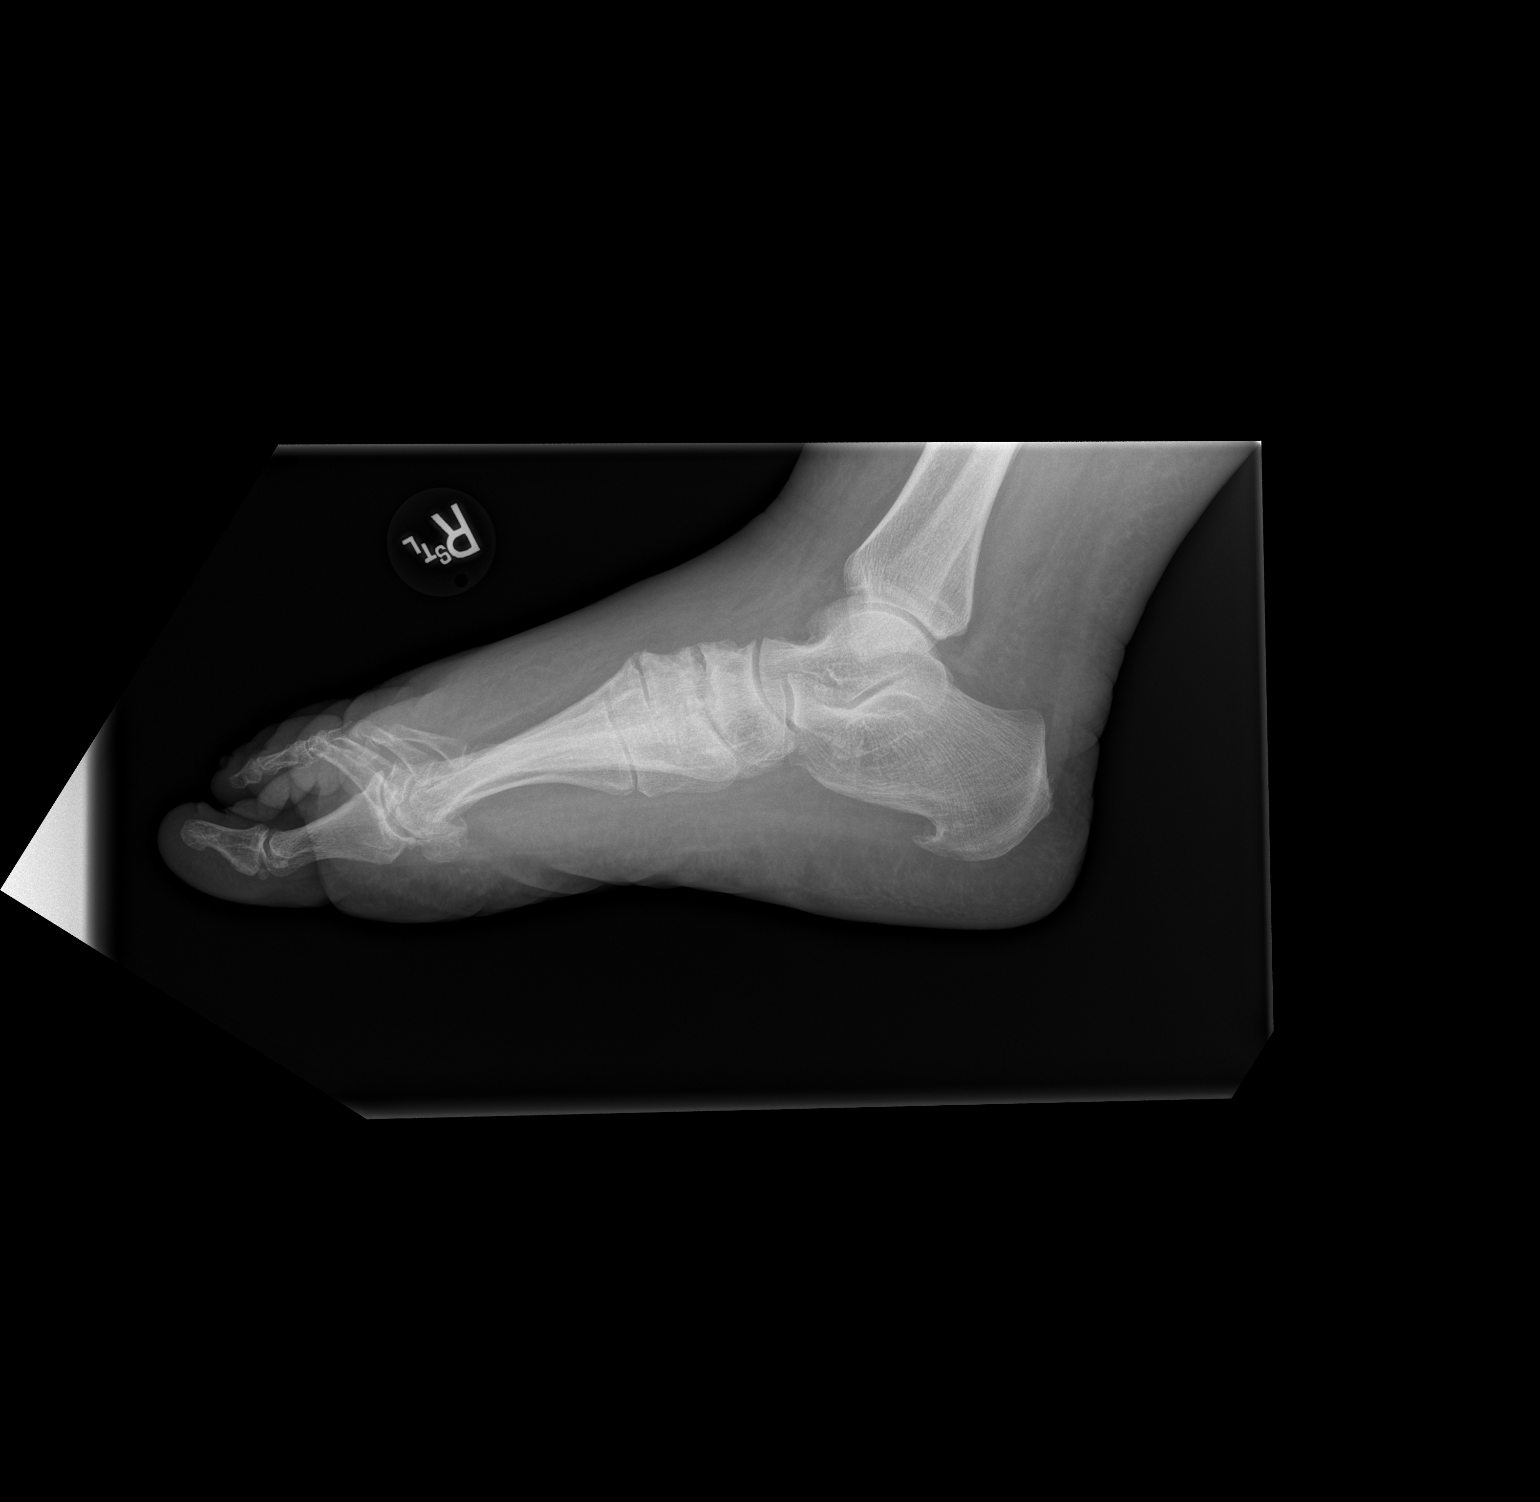

[x foot ap right]
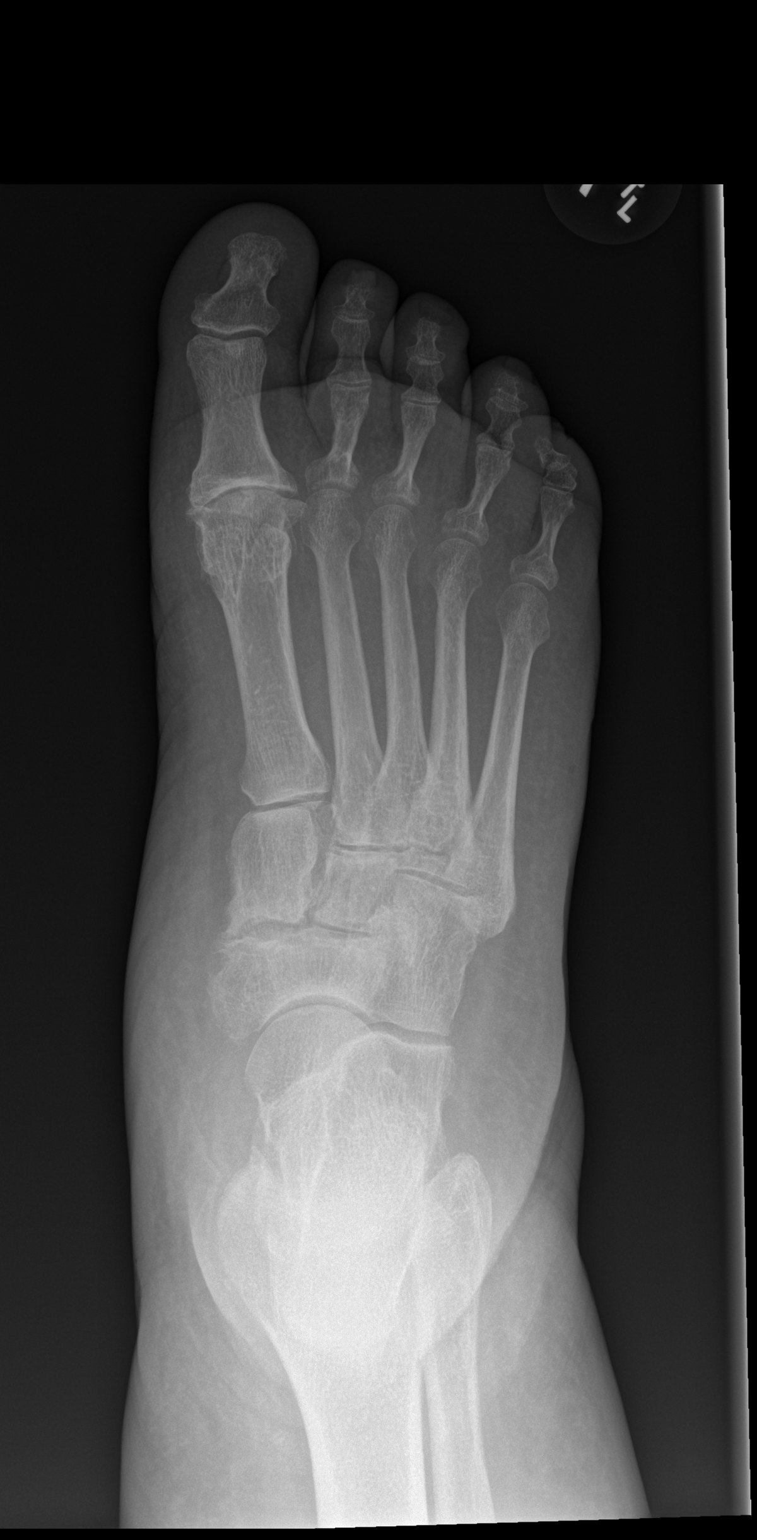

[2 of 2 positions shown; findings below may reference images not displayed]

FINDINGS: There is no acute fracture or dislocation. Mild arthritic changes of
the right knee and degenerative changes of the tarsal joints with
irregularity and spurring. There is degenerative changes of the
first MTP joint with joint space narrowing and spurring. There is
diffuse subcutaneous edema and soft tissue swelling of the dorsum of
the foot. No radiopaque foreign object or soft tissue gas.
IMPRESSION: 1. No acute fracture or dislocation.
2. Diffuse subcutaneous edema and soft tissue swelling of the dorsum
of the foot.

## 2023-12-22 IMAGING — CT CT TIBIA FIBULA *R* W/ CM
3 of 6 series · 10 of 33 positions shown, 11 images · IV contrast (agent unspecified)
Comparison: Radiograph dated 12/20/2021.

CLINICAL DATA: Concern for osteomyelitis.

EXAM:
CT OF THE LOWER RIGHT EXTREMITY WITH CONTRAST
TECHNIQUE: Multidetector CT imaging of the lower right extremity was performed
according to the standard protocol following intravenous contrast
administration.

[Series 5: axial bone · axial · 0.50mm/px · z∈[+637,+1033]mm · 4 of 397 slices shown, 5 images]
[im 67/397  soft-tissue]
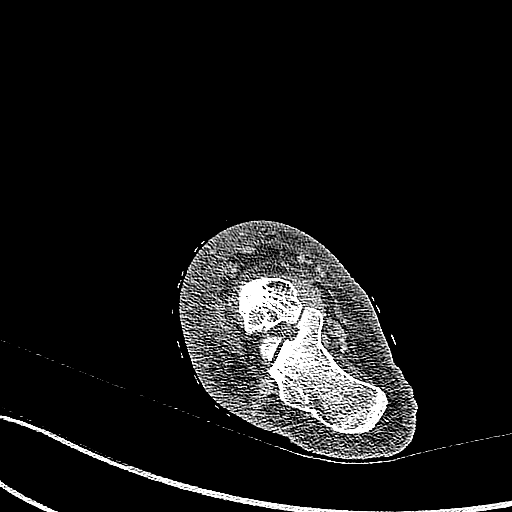
[im 67/397  bone]
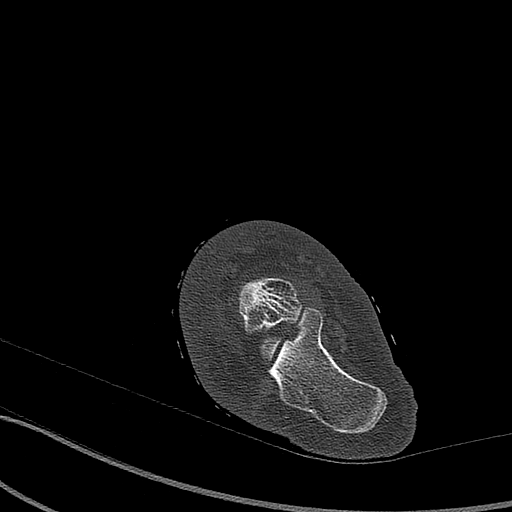
[im 133/397  bone]
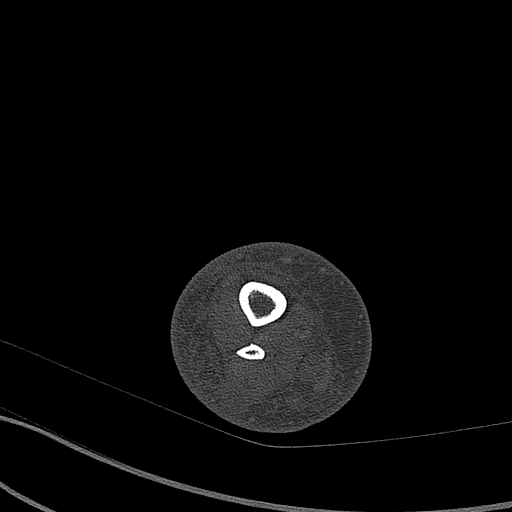
[im 265/397  bone]
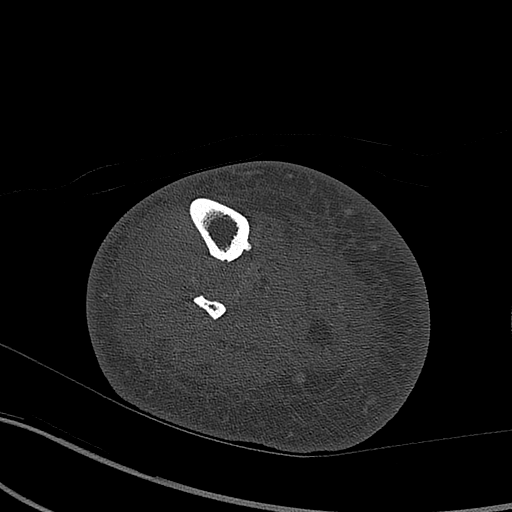
[im 331/397  bone]
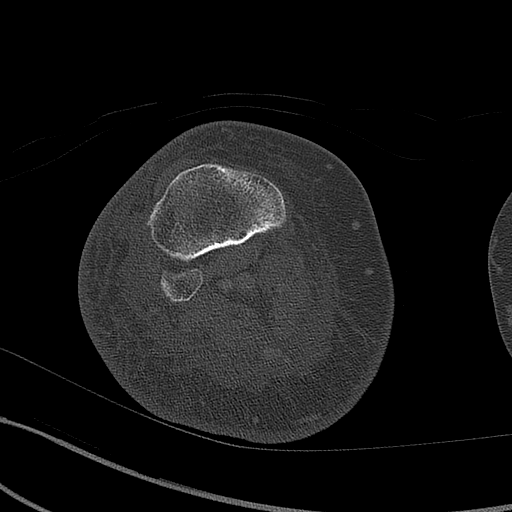

[Series 7: coronal bone · coronal · 0.51mm/px · 1 of 119 slices shown]
[im 60/119  bone]
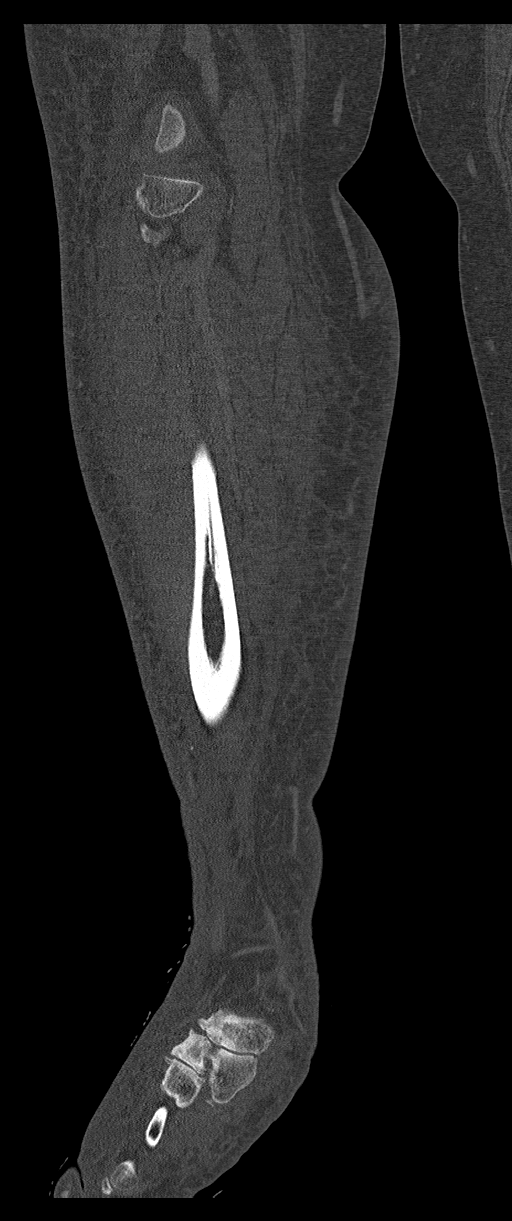

[Series 10: sagittal st · sagittal · 0.53mm/px · 5 of 124 slices shown]
[im 21/124  bone]
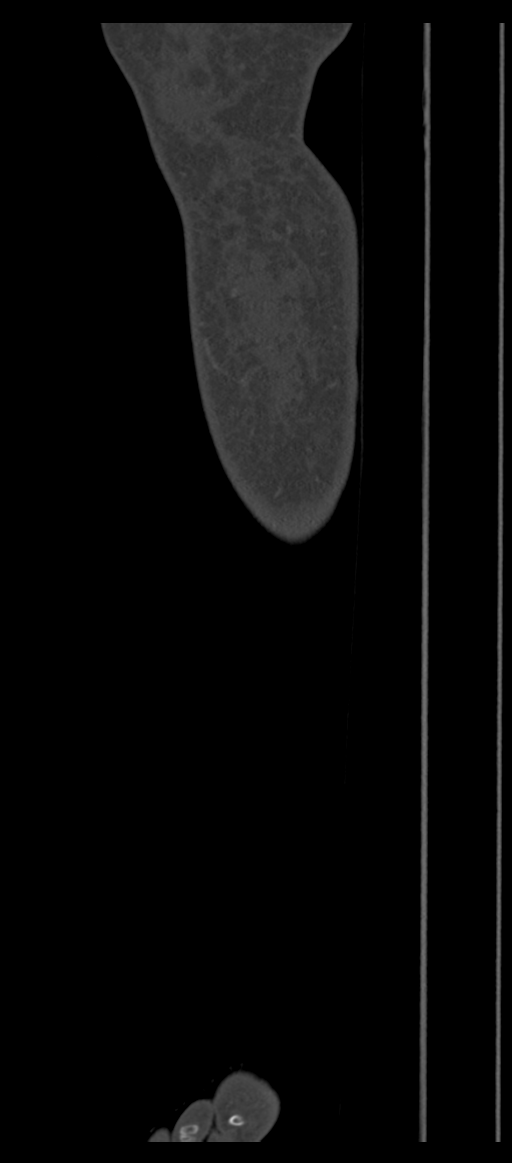
[im 42/124  bone]
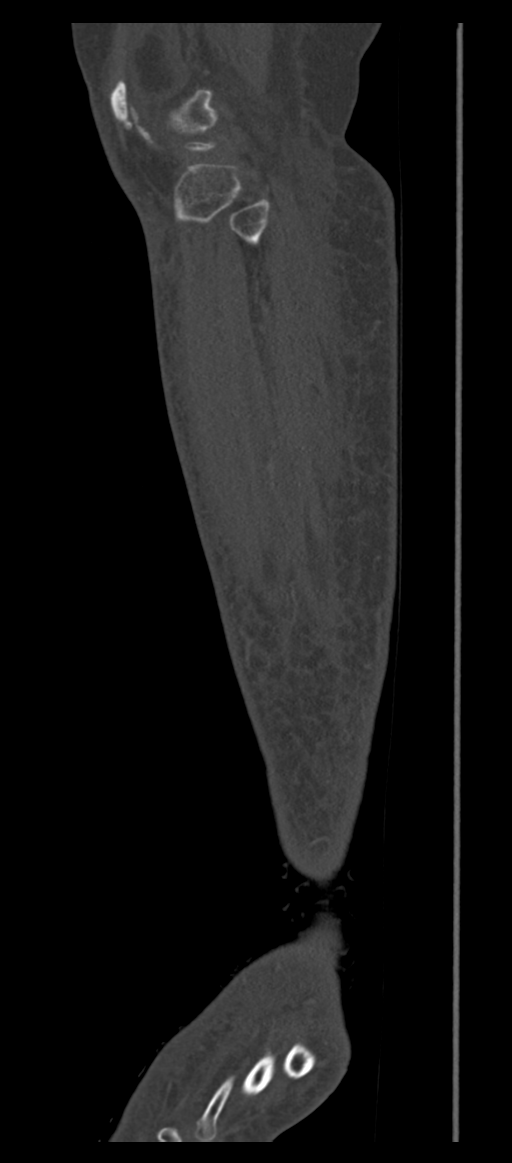
[im 62/124  bone]
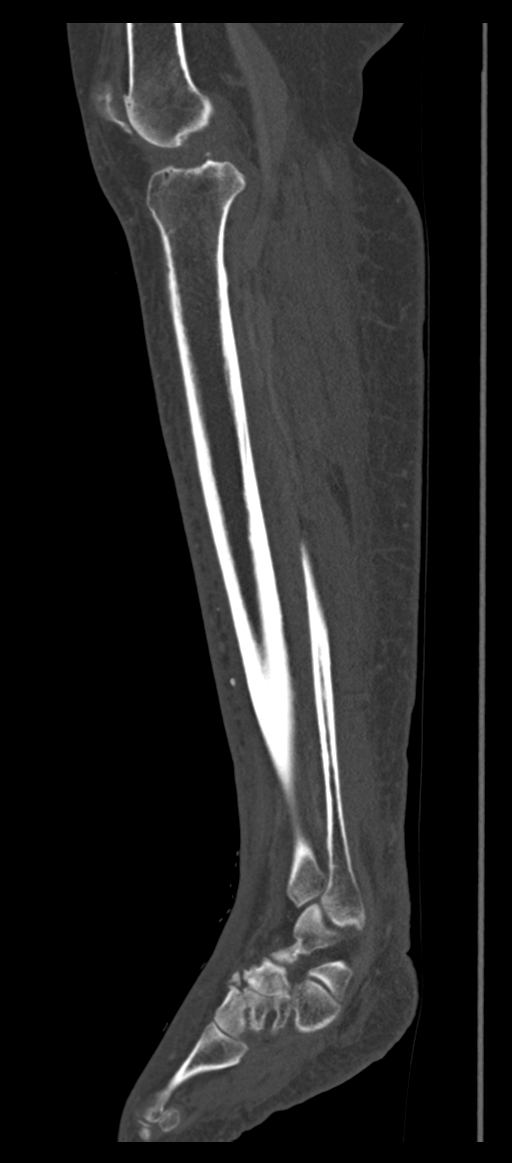
[im 83/124  bone]
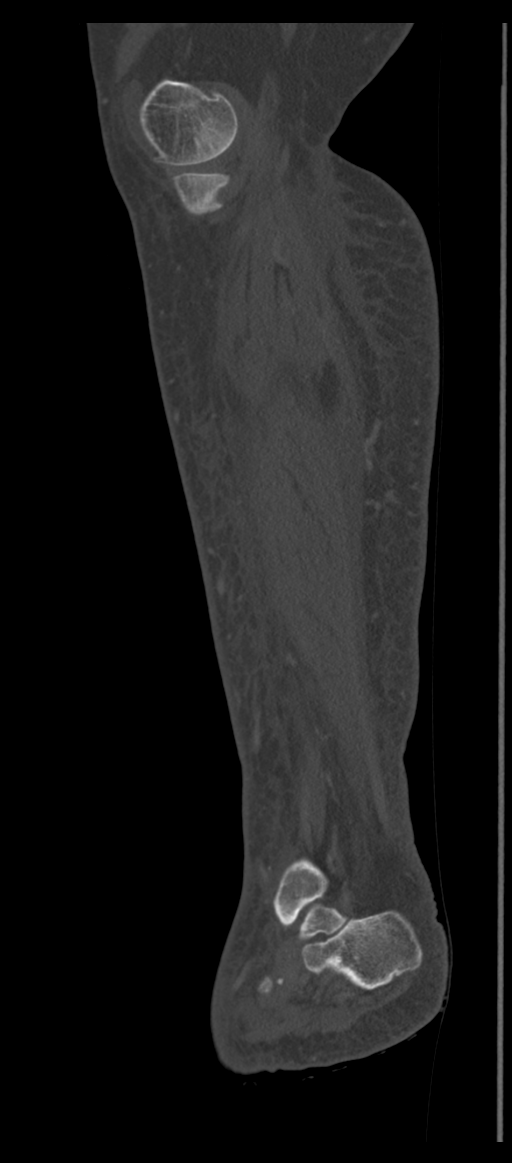
[im 103/124  bone]
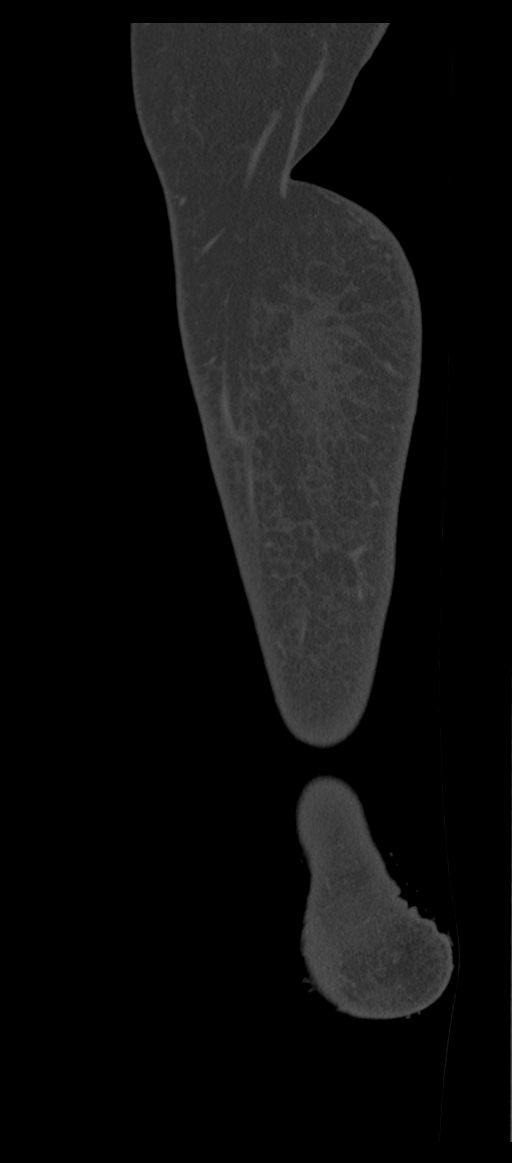

[10 of 33 positions shown; findings below may reference images not displayed]

RADIATION DOSE REDUCTION: This exam was performed according to the
departmental dose-optimization program which includes automated
exposure control, adjustment of the mA and/or kV according to
patient size and/or use of iterative reconstruction technique.

CONTRAST:  100mL OMNIPAQUE IOHEXOL 300 MG/ML  SOLN
FINDINGS: Bones/Joint/Cartilage

There is no acute fracture or dislocation. The bones are osteopenic.
Arthritic changes of the right knee with tricompartmental narrowing
most prominent involving the patellofemoral compartment. There is a
moderate suprapatellar effusion. There are degenerative changes of
the tarsal joints with cortical irregularity.

Ligaments

Suboptimally assessed by CT.

Muscles and Tendons

No intramuscular fluid collection.

Soft tissues

There is diffuse skin thickening and subcutaneous edema which may
represent cellulitis. Clinical correlation is recommended. No
drainable fluid collection/abscess. No soft tissue gas.
IMPRESSION: 1. No acute fracture or dislocation.
2. Arthritic changes of the right knee with a moderate suprapatellar
effusion.
3. Diffuse skin thickening and subcutaneous edema which may
represent cellulitis. No drainable fluid collection/abscess.
